# Patient Record
Sex: Female | Born: 1935 | Race: Black or African American | Hispanic: No | Smoking: Never smoker
Health system: Southern US, Community
[De-identification: ages and names within clinical notes are randomized; demographics above are authoritative.]

## PROBLEM LIST (undated history)

## (undated) DIAGNOSIS — I209 Angina pectoris, unspecified: Secondary | ICD-10-CM

## (undated) DIAGNOSIS — I4891 Unspecified atrial fibrillation: Secondary | ICD-10-CM

## (undated) DIAGNOSIS — I219 Acute myocardial infarction, unspecified: Secondary | ICD-10-CM

## (undated) DIAGNOSIS — I2510 Atherosclerotic heart disease of native coronary artery without angina pectoris: Secondary | ICD-10-CM

## (undated) DIAGNOSIS — I499 Cardiac arrhythmia, unspecified: Secondary | ICD-10-CM

## (undated) DIAGNOSIS — I82409 Acute embolism and thrombosis of unspecified deep veins of unspecified lower extremity: Secondary | ICD-10-CM

## (undated) DIAGNOSIS — M1990 Unspecified osteoarthritis, unspecified site: Secondary | ICD-10-CM

## (undated) DIAGNOSIS — S065XAA Traumatic subdural hemorrhage with loss of consciousness status unknown, initial encounter: Secondary | ICD-10-CM

## (undated) DIAGNOSIS — I10 Essential (primary) hypertension: Secondary | ICD-10-CM

## (undated) DIAGNOSIS — S065X9A Traumatic subdural hemorrhage with loss of consciousness of unspecified duration, initial encounter: Secondary | ICD-10-CM

## (undated) HISTORY — PX: SUBDURAL HEMATOMA EVACUATION VIA CRANIOTOMY: SUR319

---

## 2009-02-27 ENCOUNTER — Emergency Department (HOSPITAL_COMMUNITY): Admission: EM | Admit: 2009-02-27 | Discharge: 2009-02-27 | Payer: Self-pay | Admitting: Family Medicine

## 2009-03-04 ENCOUNTER — Emergency Department (HOSPITAL_COMMUNITY): Admission: EM | Admit: 2009-03-04 | Discharge: 2009-03-04 | Payer: Self-pay | Admitting: Family Medicine

## 2009-04-16 ENCOUNTER — Encounter: Admission: RE | Admit: 2009-04-16 | Discharge: 2009-04-16 | Payer: Self-pay | Admitting: Geriatric Medicine

## 2009-06-27 ENCOUNTER — Emergency Department (HOSPITAL_COMMUNITY): Admission: EM | Admit: 2009-06-27 | Discharge: 2009-06-27 | Payer: Self-pay | Admitting: Emergency Medicine

## 2009-07-10 ENCOUNTER — Emergency Department (HOSPITAL_COMMUNITY): Admission: EM | Admit: 2009-07-10 | Discharge: 2009-07-10 | Payer: Self-pay | Admitting: Emergency Medicine

## 2009-07-13 ENCOUNTER — Inpatient Hospital Stay (HOSPITAL_COMMUNITY): Admission: EM | Admit: 2009-07-13 | Discharge: 2009-07-30 | Payer: Self-pay | Admitting: Emergency Medicine

## 2009-07-26 ENCOUNTER — Ambulatory Visit: Payer: Self-pay | Admitting: Vascular Surgery

## 2009-07-26 ENCOUNTER — Encounter (INDEPENDENT_AMBULATORY_CARE_PROVIDER_SITE_OTHER): Payer: Self-pay | Admitting: Internal Medicine

## 2009-08-08 ENCOUNTER — Encounter: Admission: RE | Admit: 2009-08-08 | Discharge: 2009-08-08 | Payer: Self-pay | Admitting: Neurosurgery

## 2009-08-23 ENCOUNTER — Emergency Department (HOSPITAL_COMMUNITY): Admission: EM | Admit: 2009-08-23 | Discharge: 2009-08-24 | Payer: Self-pay | Admitting: Emergency Medicine

## 2009-09-17 ENCOUNTER — Encounter: Admission: RE | Admit: 2009-09-17 | Discharge: 2009-09-17 | Payer: Self-pay | Admitting: Neurosurgery

## 2009-11-14 IMAGING — CT CT HEAD W/O CM
1 series · 15 of 28 positions shown, 19 images · non-contrast
Comparison: 07/22/2009

CLINICAL DATA: Follow-up subdural hematoma.  History of frequent
falls.  Some headaches.

CT HEAD WITHOUT CONTRAST
TECHNIQUE: Contiguous axial images were obtained from the base of
the skull through the vertex without contrast.

[Series 2: head · axial · 0.49mm/px · z∈[+38,+170]mm · 15 of 28 slices shown, 19 images]
[im 2/28  brain]
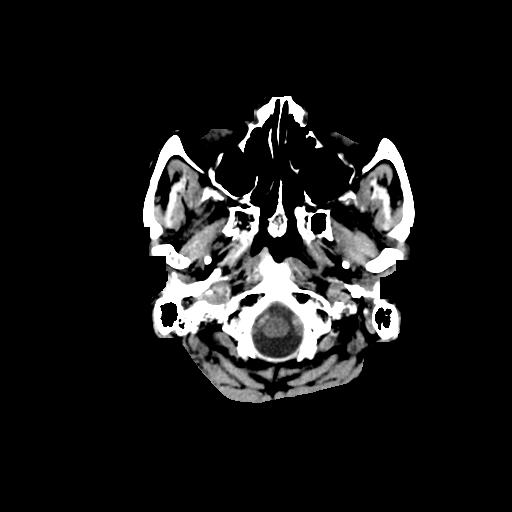
[im 2/28  bone]
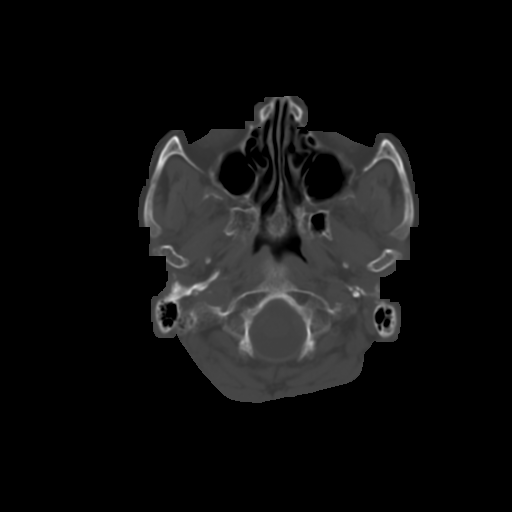
[im 4/28  brain]
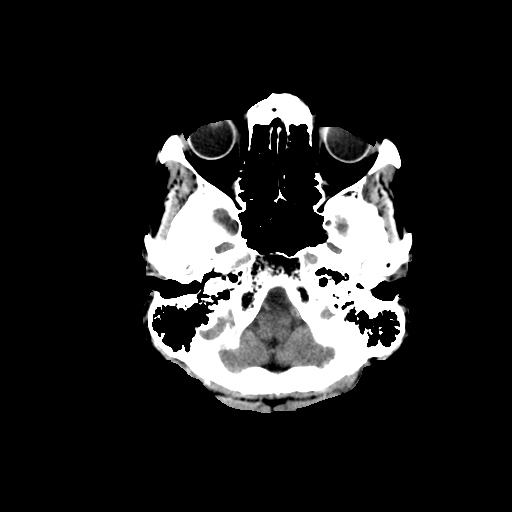
[im 6/28  brain]
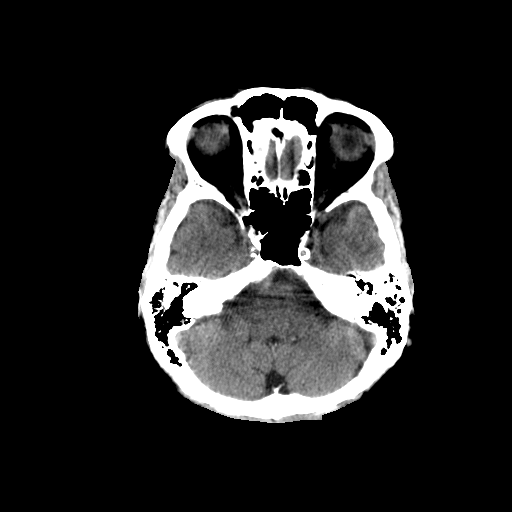
[im 8/28  brain]
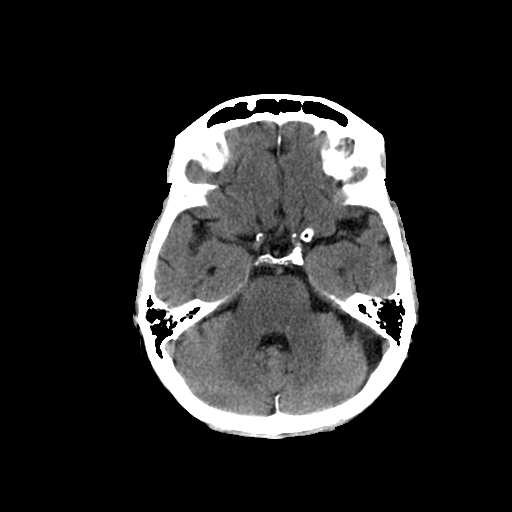
[im 9/28  brain]
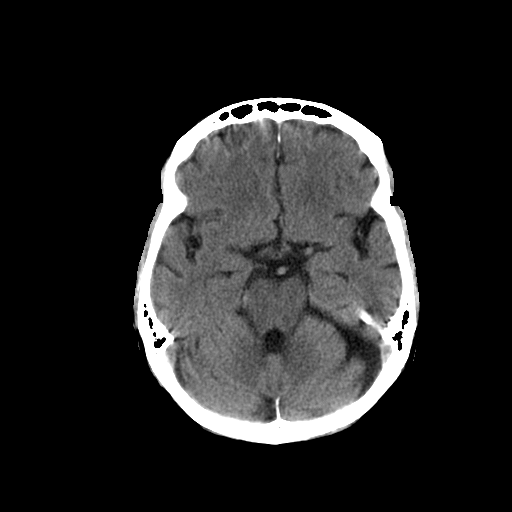
[im 9/28  bone]
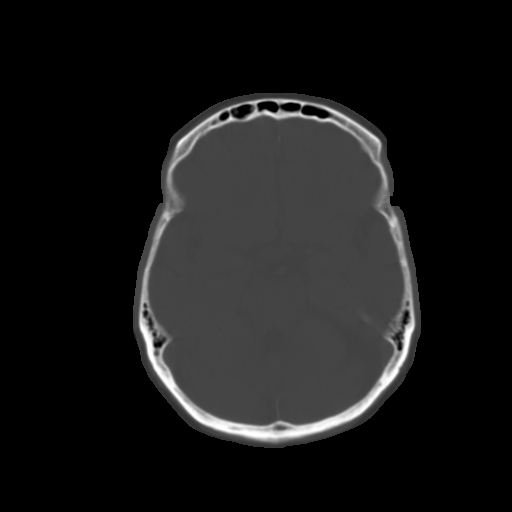
[im 11/28  brain]
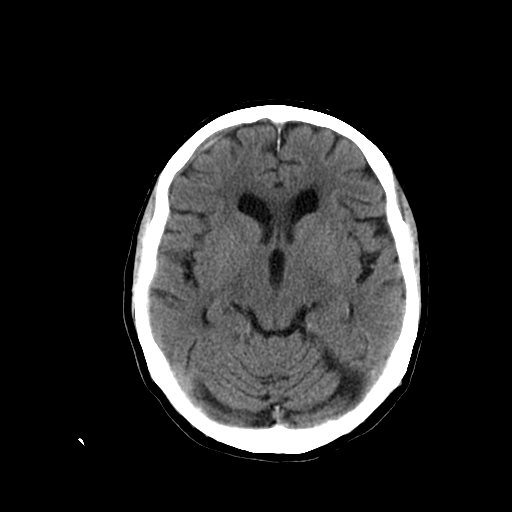
[im 13/28  brain]
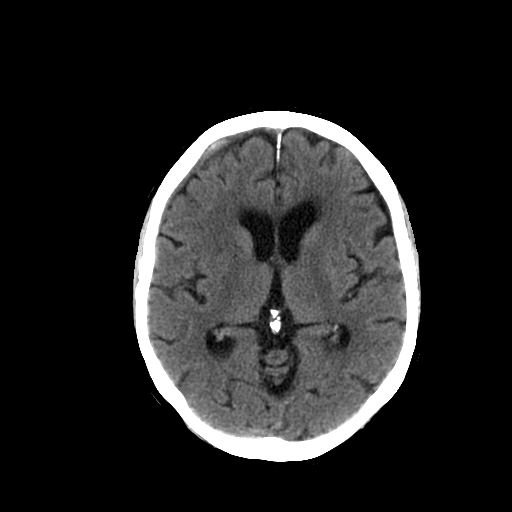
[im 15/28  brain]
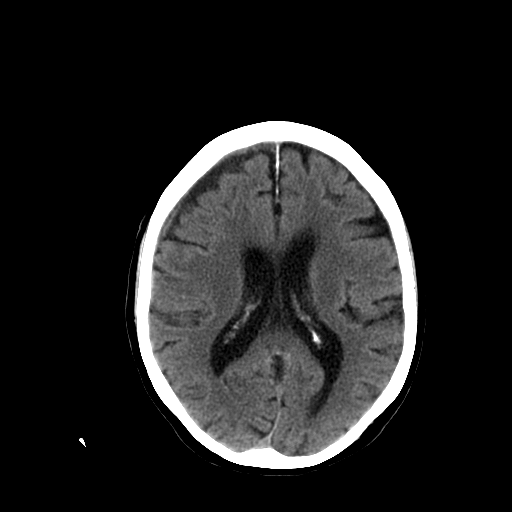
[im 16/28  brain]
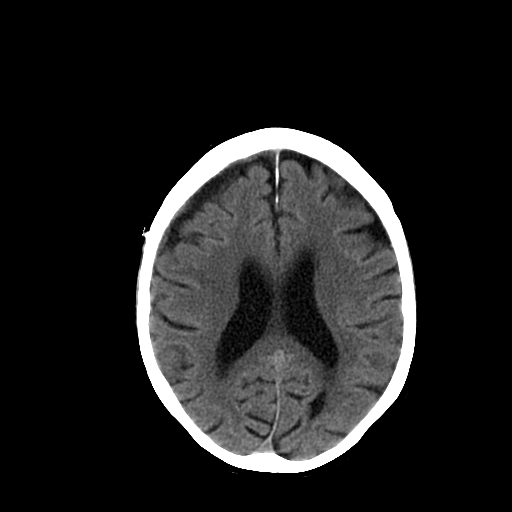
[im 16/28  bone]
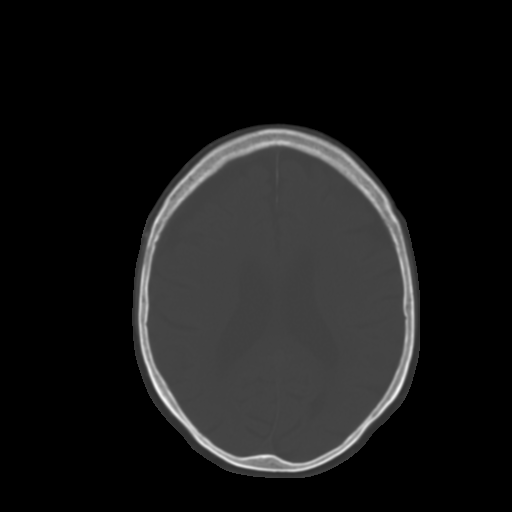
[im 18/28  brain]
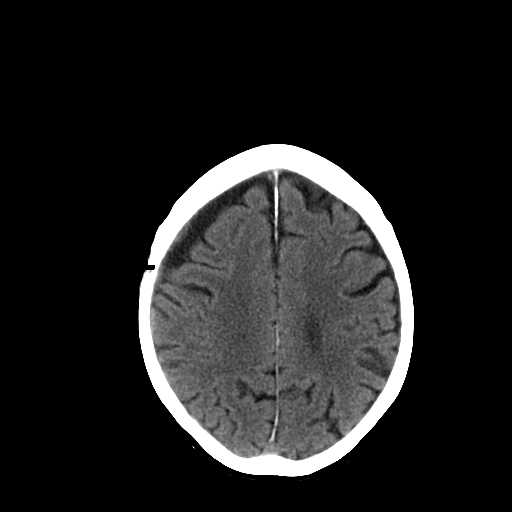
[im 20/28  brain]
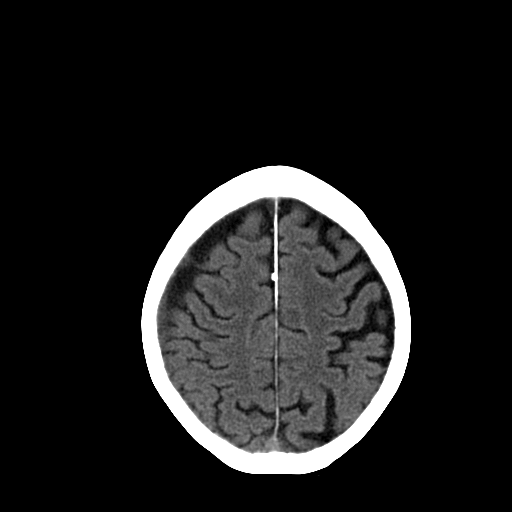
[im 21/28  brain]
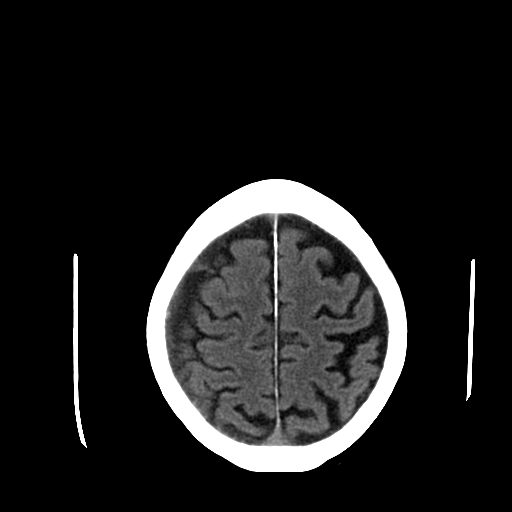
[im 23/28  brain]
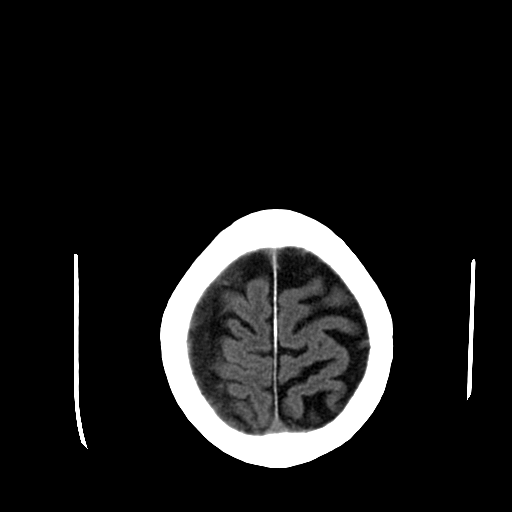
[im 23/28  bone]
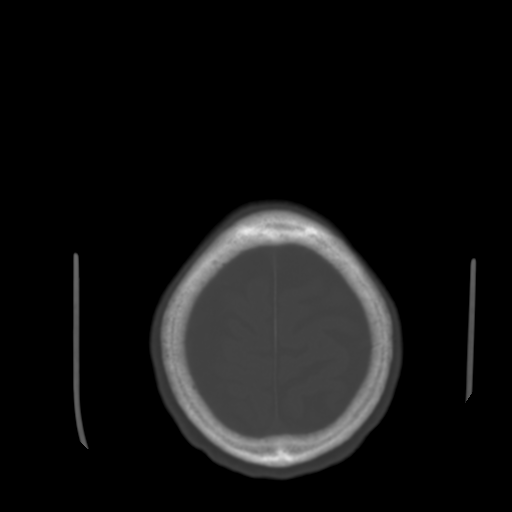
[im 25/28  brain]
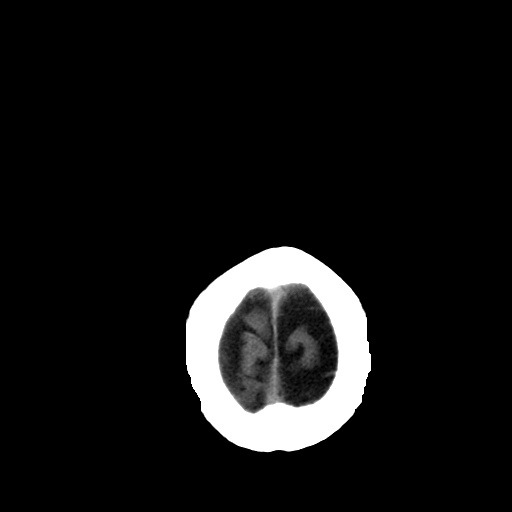
[im 27/28  brain]
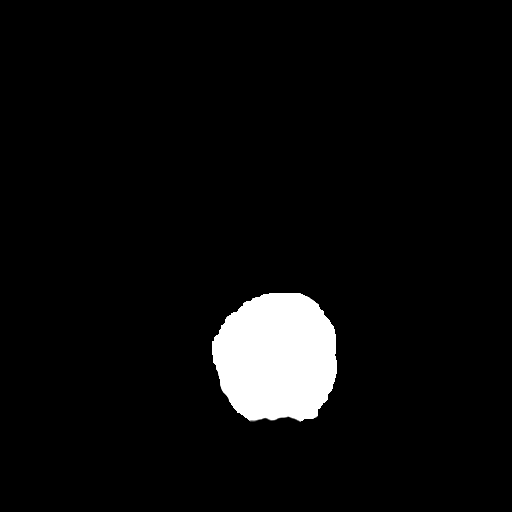

[15 of 28 positions shown; findings below may reference images not displayed]

FINDINGS: The patient is status post burr hole evacuation for a
right subdural hematoma.  Pneumocephalus has resolved.  There is
minimal residual extra-axial fluid on the right but overall the
collection has improved significantly.  Attenuation is that of CSF
or nearly CSF.  Measurement of the thickness of the frontal extra-
axial fluid collection on image 18 measures 7 mm as compared with
10 mm previously.  There is no evidence for acute hemorrhage.  The
left hemisphere is unaffected.

Baseline atrophy with chronic microvascular ischemic change again
noted.  Mild extra-axial fluid the posterior fossa likely represent
small bilateral infratentorial subdural hygromas.
IMPRESSION: Improved right subdural hematoma post evacuation.  Mild residual
extra-axial fluid on the right is decreased in thickness and
extent.

## 2009-12-24 IMAGING — CT CT HEAD W/O CM
2 series · 15 of 30 positions shown, 19 images · non-contrast
Comparison: Unenhanced cranial CT 08/08/2009 and 07/22/2009.

CLINICAL DATA: Follow up right hemispheric subdural hematoma.

CT HEAD WITHOUT CONTRAST 09/17/2009:
TECHNIQUE: Contiguous axial images were obtained from the base of
the skull through the vertex without contrast.

[Series 3: head bone · axial · 0.49mm/px · z∈[+24,+45]mm · 2 of 28 slices shown]
[im 2/28  bone]
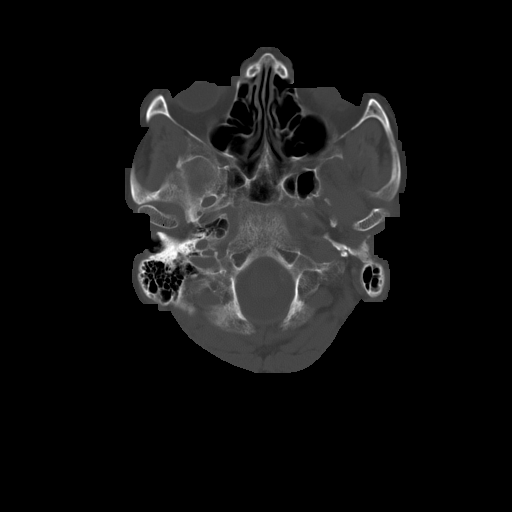
[im 6/28  bone]
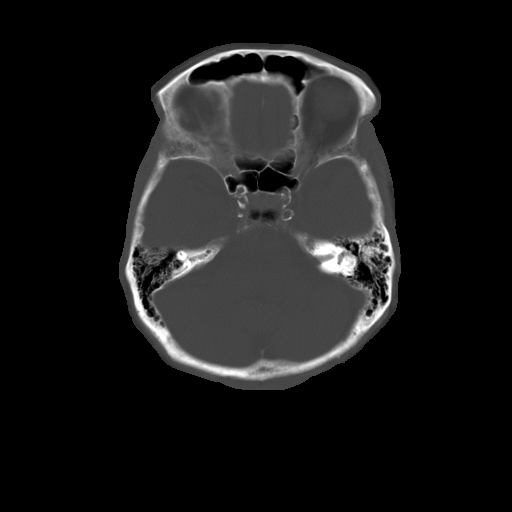

[Series 32: 3d filtered head w/o · axial · non-contrast · 0.49mm/px · z∈[+24,+151]mm · 13 of 28 slices shown, 17 images]
[im 2/28  brain]
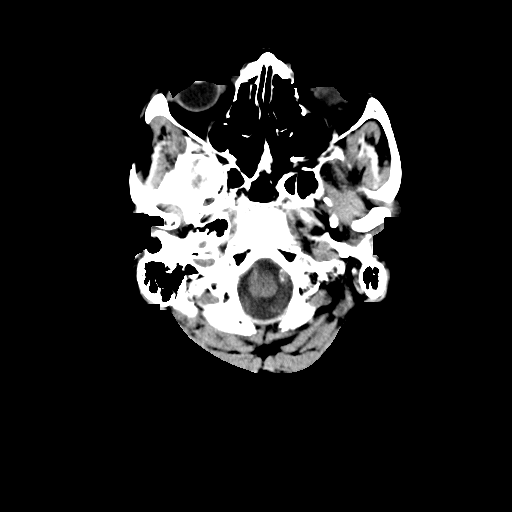
[im 2/28  bone]
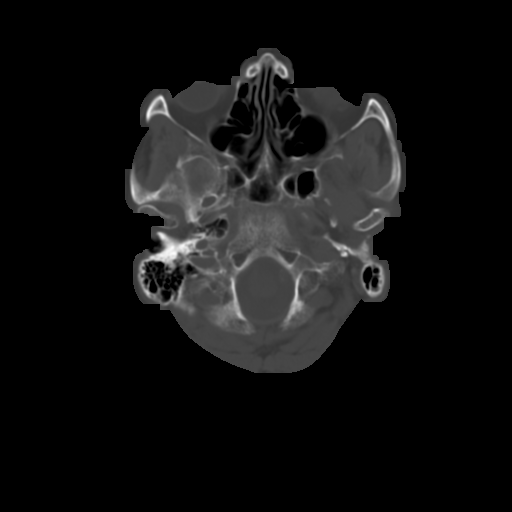
[im 4/28  brain]
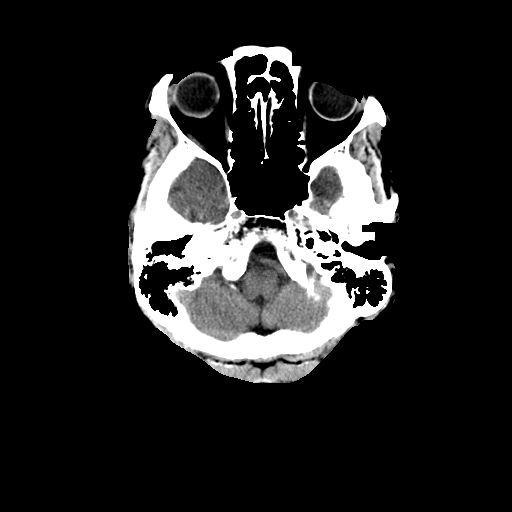
[im 6/28  brain]
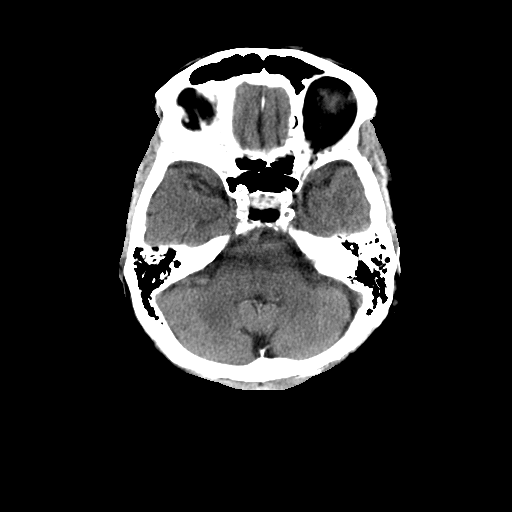
[im 8/28  brain]
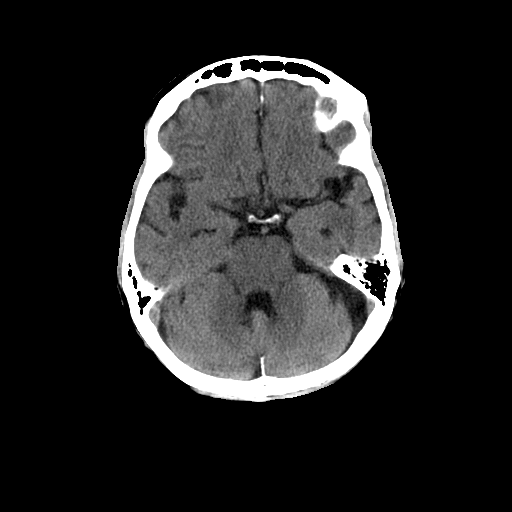
[im 10/28  brain]
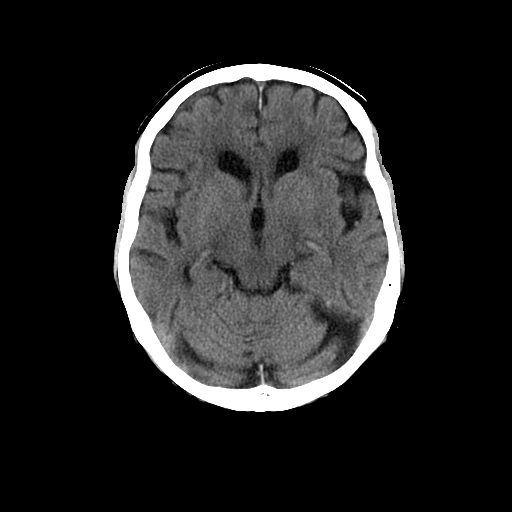
[im 10/28  bone]
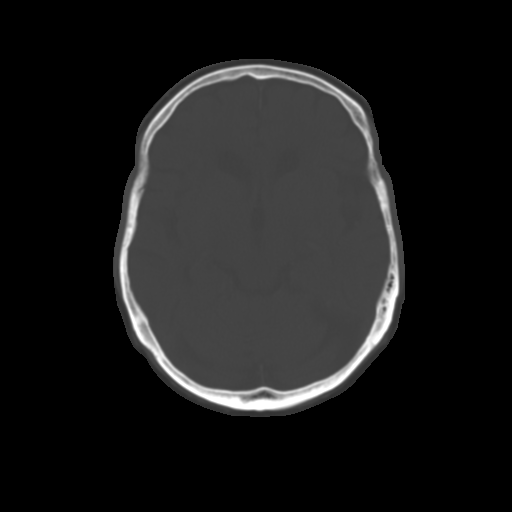
[im 12/28  brain]
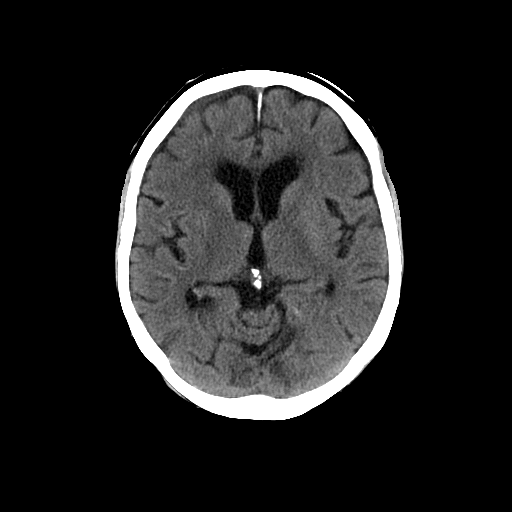
[im 14/28  brain]
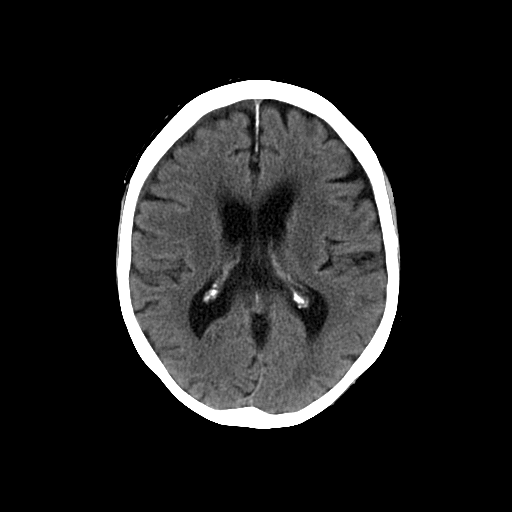
[im 16/28  brain]
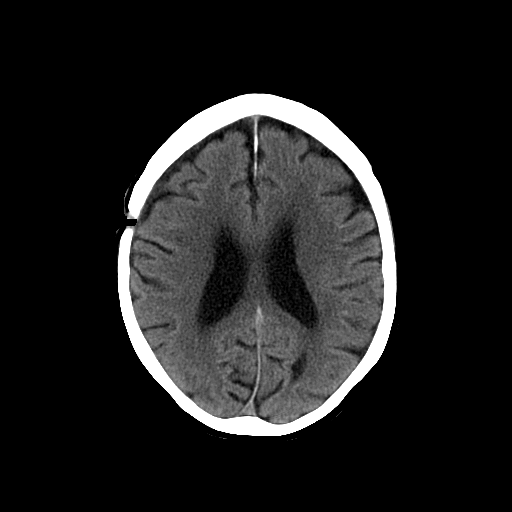
[im 18/28  brain]
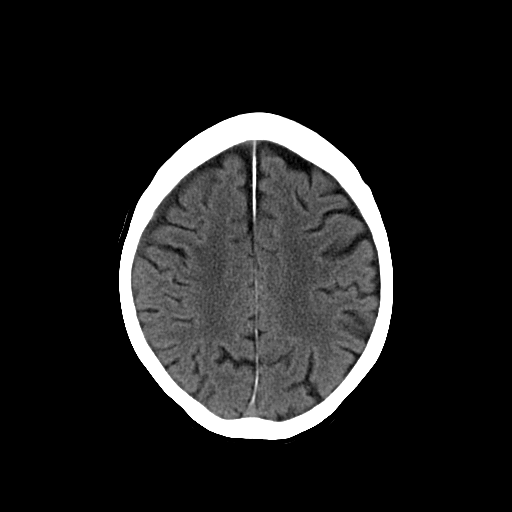
[im 18/28  bone]
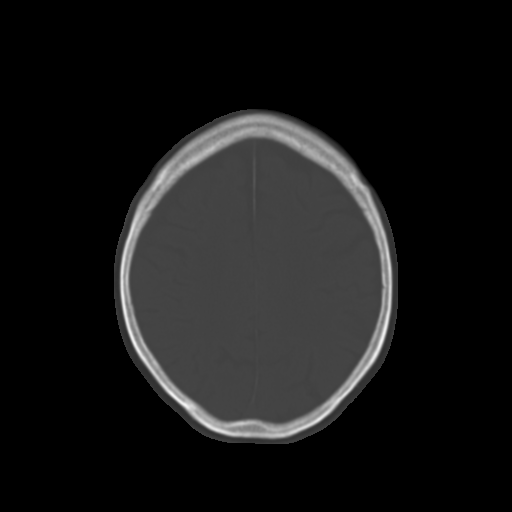
[im 20/28  brain]
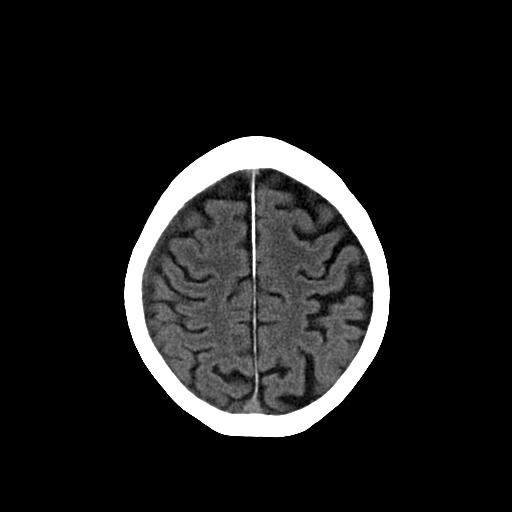
[im 22/28  brain]
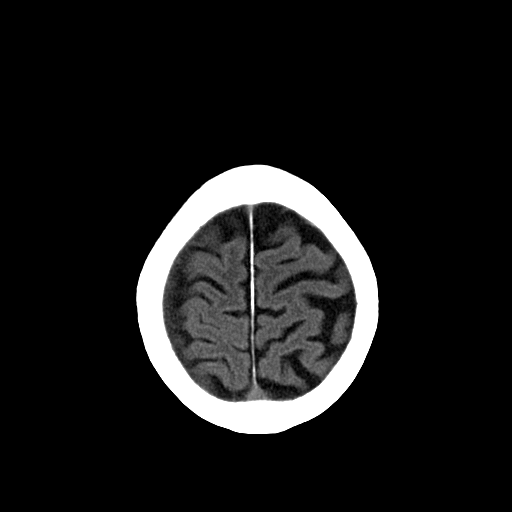
[im 24/28  brain]
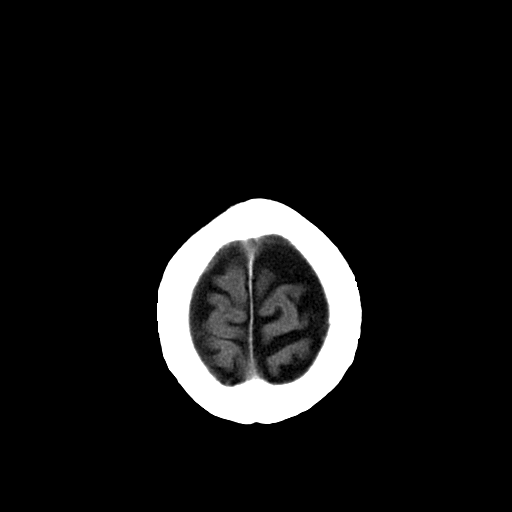
[im 26/28  brain]
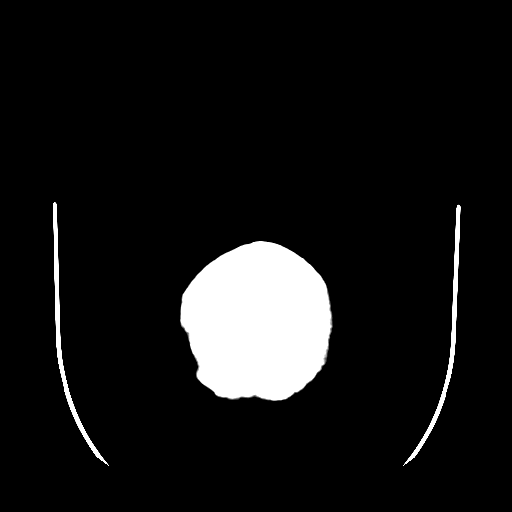
[im 26/28  bone]
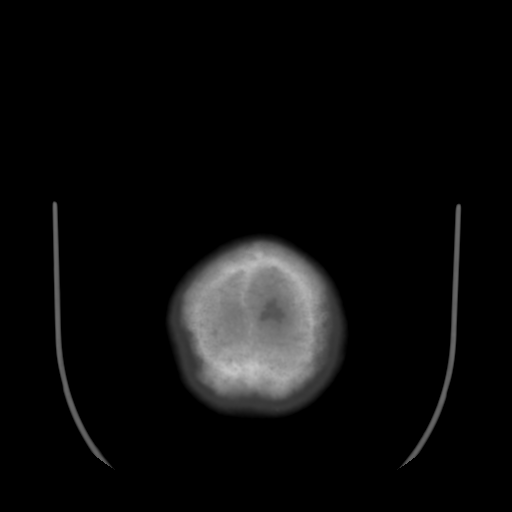

[15 of 30 positions shown; findings below may reference images not displayed]

FINDINGS: Interval near complete resolution of the right subdural
hematoma, with only minimal chronic right frontal subdural blood
persisting.  No associated mass effect or midline shift currently.
No acute hemorrhage or hematoma.  Stable moderate cortical, deep,
and cerebellar vermian atrophy.  Stable moderate changes of small
vessel disease of the white matter diffusely.  No evidence of acute
infarction at this time.  No new intracranial abnormalities.

Right frontal burr hole.  Visualized paranasal sinuses, mastoid air
cells, and middle ear cavities well-aerated.  Bilateral carotid
siphon and vertebral artery atherosclerosis again noted.
IMPRESSION: 1.  Near complete resolution of the right subdural hematoma, with
only minimal chronic subdural blood persisting in the right frontal
region.
2.  No associated mass effect or midline shift.
3.  Stable moderate generalized atrophy and moderate chronic
microvascular ischemic changes of the white matter diffusely.
4.  No new intracranial abnormalities.

## 2010-09-06 ENCOUNTER — Inpatient Hospital Stay (HOSPITAL_COMMUNITY): Admission: EM | Admit: 2010-09-06 | Discharge: 2010-09-10 | Payer: Self-pay | Admitting: Emergency Medicine

## 2010-09-07 ENCOUNTER — Encounter (INDEPENDENT_AMBULATORY_CARE_PROVIDER_SITE_OTHER): Payer: Self-pay | Admitting: Internal Medicine

## 2010-09-23 ENCOUNTER — Emergency Department (HOSPITAL_COMMUNITY): Admission: EM | Admit: 2010-09-23 | Discharge: 2010-09-23 | Payer: Self-pay | Admitting: Emergency Medicine

## 2010-12-16 ENCOUNTER — Emergency Department (HOSPITAL_COMMUNITY)
Admission: EM | Admit: 2010-12-16 | Discharge: 2010-12-16 | Payer: Self-pay | Source: Home / Self Care | Admitting: Emergency Medicine

## 2010-12-17 LAB — URINALYSIS, ROUTINE W REFLEX MICROSCOPIC
Bilirubin Urine: NEGATIVE
Ketones, ur: NEGATIVE mg/dL
Nitrite: NEGATIVE
Protein, ur: NEGATIVE mg/dL
Specific Gravity, Urine: 1.01 (ref 1.005–1.030)
Urine Glucose, Fasting: NEGATIVE mg/dL
Urobilinogen, UA: 0.2 mg/dL (ref 0.0–1.0)
pH: 5.5 (ref 5.0–8.0)

## 2010-12-17 LAB — COMPREHENSIVE METABOLIC PANEL
ALT: 17 U/L (ref 0–35)
AST: 21 U/L (ref 0–37)
Albumin: 3.4 g/dL — ABNORMAL LOW (ref 3.5–5.2)
Alkaline Phosphatase: 39 U/L (ref 39–117)
BUN: 17 mg/dL (ref 6–23)
CO2: 25 mEq/L (ref 19–32)
Calcium: 8.9 mg/dL (ref 8.4–10.5)
Chloride: 106 mEq/L (ref 96–112)
Creatinine, Ser: 1.21 mg/dL — ABNORMAL HIGH (ref 0.4–1.2)
GFR calc Af Amer: 50 mL/min — ABNORMAL LOW (ref >60)
GFR calc non Af Amer: 42 mL/min — ABNORMAL LOW (ref >60)
Glucose, Bld: 107 mg/dL — ABNORMAL HIGH (ref 70–99)
Potassium: 4.5 mEq/L (ref 3.5–5.1)
Sodium: 139 mEq/L (ref 135–145)
Total Bilirubin: 0.6 mg/dL (ref 0.3–1.2)
Total Protein: 7.2 g/dL (ref 6.0–8.3)

## 2010-12-17 LAB — CBC
HCT: 38.3 % (ref 36.0–46.0)
Hemoglobin: 12.2 g/dL (ref 12.0–15.0)
MCH: 27.1 pg (ref 26.0–34.0)
MCHC: 31.9 g/dL (ref 30.0–36.0)
MCV: 84.9 fL (ref 78.0–100.0)
Platelets: 209 10*3/uL (ref 150–400)
RBC: 4.51 MIL/uL (ref 3.87–5.11)
RDW: 18.8 % — ABNORMAL HIGH (ref 11.5–15.5)
WBC: 6.2 10*3/uL (ref 4.0–10.5)

## 2010-12-17 LAB — URINE MICROSCOPIC-ADD ON

## 2011-02-11 LAB — CBC
MCHC: 31.6 g/dL (ref 30.0–36.0)
Platelets: 198 10*3/uL (ref 150–400)
RDW: 15.8 % — ABNORMAL HIGH (ref 11.5–15.5)
WBC: 5.6 10*3/uL (ref 4.0–10.5)

## 2011-02-11 LAB — DIFFERENTIAL
Basophils Absolute: 0 10*3/uL (ref 0.0–0.1)
Basophils Relative: 0 % (ref 0–1)
Eosinophils Relative: 0 % (ref 0–5)
Lymphocytes Relative: 35 % (ref 12–46)
Neutro Abs: 2.8 10*3/uL (ref 1.7–7.7)

## 2011-02-11 LAB — HEPATIC FUNCTION PANEL
ALT: 16 U/L (ref 0–35)
AST: 18 U/L (ref 0–37)
Total Protein: 6.4 g/dL (ref 6.0–8.3)

## 2011-02-11 LAB — URINALYSIS, ROUTINE W REFLEX MICROSCOPIC
Bilirubin Urine: NEGATIVE
Nitrite: NEGATIVE
Specific Gravity, Urine: 1.012 (ref 1.005–1.030)
Urobilinogen, UA: 0.2 mg/dL (ref 0.0–1.0)
pH: 7.5 (ref 5.0–8.0)

## 2011-02-11 LAB — BASIC METABOLIC PANEL
BUN: 16 mg/dL (ref 6–23)
Creatinine, Ser: 1.14 mg/dL (ref 0.4–1.2)
GFR calc non Af Amer: 45 mL/min — ABNORMAL LOW (ref >60)

## 2011-02-11 LAB — URINE MICROSCOPIC-ADD ON

## 2011-02-12 LAB — CBC
HCT: 36.2 % (ref 36.0–46.0)
HCT: 37.6 % (ref 36.0–46.0)
Hemoglobin: 11.3 g/dL — ABNORMAL LOW (ref 12.0–15.0)
Hemoglobin: 12.1 g/dL (ref 12.0–15.0)
MCH: 27.8 pg (ref 26.0–34.0)
MCH: 28.3 pg (ref 26.0–34.0)
MCH: 28.5 pg (ref 26.0–34.0)
MCHC: 31.4 g/dL (ref 30.0–36.0)
MCHC: 31.7 g/dL (ref 30.0–36.0)
MCHC: 32 g/dL (ref 30.0–36.0)
MCHC: 32 g/dL (ref 30.0–36.0)
MCHC: 32.2 g/dL (ref 30.0–36.0)
MCV: 88.5 fL (ref 78.0–100.0)
MCV: 88.5 fL (ref 78.0–100.0)
MCV: 88.6 fL (ref 78.0–100.0)
Platelets: 238 10*3/uL (ref 150–400)
Platelets: 242 10*3/uL (ref 150–400)
RBC: 3.99 MIL/uL (ref 3.87–5.11)
RBC: 4.03 MIL/uL (ref 3.87–5.11)
RBC: 4.25 MIL/uL (ref 3.87–5.11)
RDW: 15.3 % (ref 11.5–15.5)
WBC: 5 10*3/uL (ref 4.0–10.5)

## 2011-02-12 LAB — DIFFERENTIAL
Basophils Absolute: 0 10*3/uL (ref 0.0–0.1)
Basophils Absolute: 0 10*3/uL (ref 0.0–0.1)
Basophils Absolute: 0 10*3/uL (ref 0.0–0.1)
Basophils Relative: 0 % (ref 0–1)
Eosinophils Absolute: 0 10*3/uL (ref 0.0–0.7)
Eosinophils Absolute: 0 10*3/uL (ref 0.0–0.7)
Eosinophils Relative: 0 % (ref 0–5)
Eosinophils Relative: 1 % (ref 0–5)
Lymphocytes Relative: 27 % (ref 12–46)
Lymphocytes Relative: 33 % (ref 12–46)
Lymphocytes Relative: 34 % (ref 12–46)
Lymphs Abs: 1.4 10*3/uL (ref 0.7–4.0)
Lymphs Abs: 1.7 10*3/uL (ref 0.7–4.0)
Lymphs Abs: 1.8 10*3/uL (ref 0.7–4.0)
Lymphs Abs: 2.2 10*3/uL (ref 0.7–4.0)
Monocytes Absolute: 0.7 10*3/uL (ref 0.1–1.0)
Monocytes Absolute: 0.7 10*3/uL (ref 0.1–1.0)
Monocytes Relative: 13 % — ABNORMAL HIGH (ref 3–12)
Neutro Abs: 2.5 10*3/uL (ref 1.7–7.7)
Neutro Abs: 2.9 10*3/uL (ref 1.7–7.7)
Neutro Abs: 3.4 10*3/uL (ref 1.7–7.7)
Neutrophils Relative %: 50 % (ref 43–77)
Neutrophils Relative %: 52 % (ref 43–77)
Neutrophils Relative %: 54 % (ref 43–77)
Neutrophils Relative %: 60 % (ref 43–77)

## 2011-02-12 LAB — GLUCOSE, CAPILLARY
Glucose-Capillary: 102 mg/dL — ABNORMAL HIGH (ref 70–99)
Glucose-Capillary: 86 mg/dL (ref 70–99)

## 2011-02-12 LAB — POCT CARDIAC MARKERS
CKMB, poc: 1.3 ng/mL (ref 1.0–8.0)
Troponin i, poc: <0.05 ng/mL (ref 0.00–0.09)

## 2011-02-12 LAB — COMPREHENSIVE METABOLIC PANEL
ALT: 16 U/L (ref 0–35)
AST: 17 U/L (ref 0–37)
AST: 20 U/L (ref 0–37)
Albumin: 2.5 g/dL — ABNORMAL LOW (ref 3.5–5.2)
Albumin: 2.6 g/dL — ABNORMAL LOW (ref 3.5–5.2)
Alkaline Phosphatase: 35 U/L — ABNORMAL LOW (ref 39–117)
BUN: 11 mg/dL (ref 6–23)
BUN: 15 mg/dL (ref 6–23)
BUN: 15 mg/dL (ref 6–23)
CO2: 24 mEq/L (ref 19–32)
CO2: 25 mEq/L (ref 19–32)
CO2: 28 mEq/L (ref 19–32)
Calcium: 8 mg/dL — ABNORMAL LOW (ref 8.4–10.5)
Calcium: 8.1 mg/dL — ABNORMAL LOW (ref 8.4–10.5)
Calcium: 8.2 mg/dL — ABNORMAL LOW (ref 8.4–10.5)
Calcium: 8.4 mg/dL (ref 8.4–10.5)
Calcium: 8.7 mg/dL (ref 8.4–10.5)
Chloride: 107 mEq/L (ref 96–112)
Chloride: 107 mEq/L (ref 96–112)
Chloride: 108 mEq/L (ref 96–112)
Chloride: 110 mEq/L (ref 96–112)
Creatinine, Ser: 0.87 mg/dL (ref 0.4–1.2)
Creatinine, Ser: 1.04 mg/dL (ref 0.4–1.2)
Creatinine, Ser: 1.07 mg/dL (ref 0.4–1.2)
GFR calc Af Amer: >60 mL/min (ref >60)
GFR calc Af Amer: >60 mL/min (ref >60)
GFR calc non Af Amer: 48 mL/min — ABNORMAL LOW (ref >60)
GFR calc non Af Amer: 50 mL/min — ABNORMAL LOW (ref >60)
GFR calc non Af Amer: 53 mL/min — ABNORMAL LOW (ref >60)
GFR calc non Af Amer: >60 mL/min (ref >60)
Glucose, Bld: 87 mg/dL (ref 70–99)
Glucose, Bld: 94 mg/dL (ref 70–99)
Glucose, Bld: 95 mg/dL (ref 70–99)
Potassium: 4.2 mEq/L (ref 3.5–5.1)
Sodium: 142 mEq/L (ref 135–145)
Sodium: 143 mEq/L (ref 135–145)
Total Bilirubin: 0.4 mg/dL (ref 0.3–1.2)
Total Bilirubin: 0.4 mg/dL (ref 0.3–1.2)
Total Bilirubin: 0.6 mg/dL (ref 0.3–1.2)
Total Bilirubin: 0.6 mg/dL (ref 0.3–1.2)
Total Protein: 5.9 g/dL — ABNORMAL LOW (ref 6.0–8.3)

## 2011-02-12 LAB — CK TOTAL AND CKMB (NOT AT ARMC)
CK, MB: 2.1 ng/mL (ref 0.3–4.0)
Relative Index: 2.1 (ref 0.0–2.5)

## 2011-02-12 LAB — MRSA PCR SCREENING: MRSA by PCR: POSITIVE — AB

## 2011-02-12 LAB — CARDIAC PANEL(CRET KIN+CKTOT+MB+TROPI)
Relative Index: INVALID (ref 0.0–2.5)
Relative Index: INVALID (ref 0.0–2.5)
Total CK: 88 U/L (ref 7–177)
Troponin I: 0.02 ng/mL (ref 0.00–0.06)

## 2011-02-12 LAB — BRAIN NATRIURETIC PEPTIDE
Pro B Natriuretic peptide (BNP): 1021 pg/mL — ABNORMAL HIGH (ref 0.0–100.0)
Pro B Natriuretic peptide (BNP): 634 pg/mL — ABNORMAL HIGH (ref 0.0–100.0)

## 2011-02-12 LAB — URINALYSIS, ROUTINE W REFLEX MICROSCOPIC
Bilirubin Urine: NEGATIVE
Glucose, UA: NEGATIVE mg/dL
Ketones, ur: 15 mg/dL — AB
Protein, ur: 30 mg/dL — AB
Urobilinogen, UA: 0.2 mg/dL (ref 0.0–1.0)

## 2011-02-12 LAB — TROPONIN I: Troponin I: 0.03 ng/mL (ref 0.00–0.06)

## 2011-02-12 LAB — PHOSPHORUS: Phosphorus: 3 mg/dL (ref 2.3–4.6)

## 2011-02-12 LAB — TSH: TSH: 4.046 u[IU]/mL (ref 0.350–4.500)

## 2011-02-12 LAB — LIPASE, BLOOD: Lipase: 34 U/L (ref 11–59)

## 2011-02-25 ENCOUNTER — Ambulatory Visit
Admission: RE | Admit: 2011-02-25 | Discharge: 2011-02-25 | Disposition: A | Discharge status: Home / Self Care | Payer: Medicare HMO | Source: Ambulatory Visit | Attending: Gastroenterology | Admitting: Gastroenterology

## 2011-02-25 ENCOUNTER — Other Ambulatory Visit: Payer: Self-pay | Admitting: Gastroenterology

## 2011-02-25 DIAGNOSIS — R197 Diarrhea, unspecified: Secondary | ICD-10-CM

## 2011-03-06 LAB — POCT I-STAT, CHEM 8
BUN: 20 mg/dL (ref 6–23)
Calcium, Ion: 1.19 mmol/L (ref 1.12–1.32)
Creatinine, Ser: 0.6 mg/dL (ref 0.4–1.2)
TCO2: 28 mmol/L (ref 0–100)

## 2011-03-06 LAB — CBC
HCT: 35.8 % — ABNORMAL LOW (ref 36.0–46.0)
Hemoglobin: 12 g/dL (ref 12.0–15.0)
WBC: 7.1 10*3/uL (ref 4.0–10.5)

## 2011-03-06 LAB — DIFFERENTIAL
Eosinophils Relative: 0 % (ref 0–5)
Lymphocytes Relative: 21 % (ref 12–46)
Lymphs Abs: 1.5 10*3/uL (ref 0.7–4.0)
Monocytes Absolute: 0.8 10*3/uL (ref 0.1–1.0)
Monocytes Relative: 12 % (ref 3–12)

## 2011-03-06 LAB — URINALYSIS, ROUTINE W REFLEX MICROSCOPIC
Glucose, UA: NEGATIVE mg/dL
Ketones, ur: NEGATIVE mg/dL
Protein, ur: NEGATIVE mg/dL

## 2011-03-06 LAB — HEPATIC FUNCTION PANEL
Alkaline Phosphatase: 46 U/L (ref 39–117)
Indirect Bilirubin: 0.4 mg/dL (ref 0.3–0.9)
Total Bilirubin: 0.5 mg/dL (ref 0.3–1.2)

## 2011-03-06 LAB — URINE MICROSCOPIC-ADD ON

## 2011-03-06 LAB — URINE CULTURE: Colony Count: 45000

## 2011-03-07 LAB — BASIC METABOLIC PANEL
BUN: 11 mg/dL (ref 6–23)
BUN: 21 mg/dL (ref 6–23)
BUN: 33 mg/dL — ABNORMAL HIGH (ref 6–23)
BUN: 34 mg/dL — ABNORMAL HIGH (ref 6–23)
BUN: 35 mg/dL — ABNORMAL HIGH (ref 6–23)
BUN: 40 mg/dL — ABNORMAL HIGH (ref 6–23)
BUN: 21 mg/dL (ref 6–23)
CO2: 27 mEq/L (ref 19–32)
CO2: 28 mEq/L (ref 19–32)
CO2: 29 mEq/L (ref 19–32)
CO2: 29 mEq/L (ref 19–32)
CO2: 29 mEq/L (ref 19–32)
CO2: 31 mEq/L (ref 19–32)
CO2: 26 mEq/L (ref 19–32)
Calcium: 10.1 mg/dL (ref 8.4–10.5)
Calcium: 8.2 mg/dL — ABNORMAL LOW (ref 8.4–10.5)
Calcium: 8.2 mg/dL — ABNORMAL LOW (ref 8.4–10.5)
Calcium: 8.9 mg/dL (ref 8.4–10.5)
Calcium: 9.8 mg/dL (ref 8.4–10.5)
Calcium: 9.8 mg/dL (ref 8.4–10.5)
Chloride: 100 mEq/L (ref 96–112)
Chloride: 101 mEq/L (ref 96–112)
Chloride: 102 mEq/L (ref 96–112)
Chloride: 105 mEq/L (ref 96–112)
Chloride: 98 mEq/L (ref 96–112)
Creatinine, Ser: 0.71 mg/dL (ref 0.4–1.2)
Creatinine, Ser: 0.77 mg/dL (ref 0.4–1.2)
Creatinine, Ser: 0.84 mg/dL (ref 0.4–1.2)
Creatinine, Ser: 0.96 mg/dL (ref 0.4–1.2)
Creatinine, Ser: 1.1 mg/dL (ref 0.4–1.2)
Creatinine, Ser: 1.17 mg/dL (ref 0.4–1.2)
Creatinine, Ser: 1.2 mg/dL (ref 0.4–1.2)
Creatinine, Ser: 1.28 mg/dL — ABNORMAL HIGH (ref 0.4–1.2)
GFR calc Af Amer: 47 mL/min — ABNORMAL LOW (ref >60)
GFR calc Af Amer: 56 mL/min — ABNORMAL LOW (ref >60)
GFR calc Af Amer: >60 mL/min (ref >60)
GFR calc Af Amer: >60 mL/min (ref >60)
GFR calc Af Amer: >60 mL/min (ref >60)
GFR calc Af Amer: >60 mL/min (ref >60)
GFR calc non Af Amer: 42 mL/min — ABNORMAL LOW (ref >60)
GFR calc non Af Amer: 43 mL/min — ABNORMAL LOW (ref >60)
GFR calc non Af Amer: 47 mL/min — ABNORMAL LOW (ref >60)
GFR calc non Af Amer: 55 mL/min — ABNORMAL LOW (ref >60)
GFR calc non Af Amer: 48 mL/min — ABNORMAL LOW (ref >60)
Glucose, Bld: 110 mg/dL — ABNORMAL HIGH (ref 70–99)
Glucose, Bld: 112 mg/dL — ABNORMAL HIGH (ref 70–99)
Glucose, Bld: 96 mg/dL (ref 70–99)
Glucose, Bld: 163 mg/dL — ABNORMAL HIGH (ref 70–99)
Potassium: 3.8 mEq/L (ref 3.5–5.1)
Potassium: 4.1 mEq/L (ref 3.5–5.1)
Potassium: 4.7 mEq/L (ref 3.5–5.1)
Potassium: 4.4 mEq/L (ref 3.5–5.1)
Sodium: 137 mEq/L (ref 135–145)
Sodium: 139 mEq/L (ref 135–145)
Sodium: 134 mEq/L — ABNORMAL LOW (ref 135–145)

## 2011-03-07 LAB — GLUCOSE, CAPILLARY
Glucose-Capillary: 104 mg/dL — ABNORMAL HIGH (ref 70–99)
Glucose-Capillary: 104 mg/dL — ABNORMAL HIGH (ref 70–99)
Glucose-Capillary: 107 mg/dL — ABNORMAL HIGH (ref 70–99)
Glucose-Capillary: 110 mg/dL — ABNORMAL HIGH (ref 70–99)
Glucose-Capillary: 111 mg/dL — ABNORMAL HIGH (ref 70–99)
Glucose-Capillary: 112 mg/dL — ABNORMAL HIGH (ref 70–99)
Glucose-Capillary: 117 mg/dL — ABNORMAL HIGH (ref 70–99)
Glucose-Capillary: 118 mg/dL — ABNORMAL HIGH (ref 70–99)
Glucose-Capillary: 118 mg/dL — ABNORMAL HIGH (ref 70–99)
Glucose-Capillary: 121 mg/dL — ABNORMAL HIGH (ref 70–99)
Glucose-Capillary: 125 mg/dL — ABNORMAL HIGH (ref 70–99)
Glucose-Capillary: 126 mg/dL — ABNORMAL HIGH (ref 70–99)
Glucose-Capillary: 128 mg/dL — ABNORMAL HIGH (ref 70–99)
Glucose-Capillary: 131 mg/dL — ABNORMAL HIGH (ref 70–99)
Glucose-Capillary: 132 mg/dL — ABNORMAL HIGH (ref 70–99)
Glucose-Capillary: 132 mg/dL — ABNORMAL HIGH (ref 70–99)
Glucose-Capillary: 146 mg/dL — ABNORMAL HIGH (ref 70–99)
Glucose-Capillary: 147 mg/dL — ABNORMAL HIGH (ref 70–99)
Glucose-Capillary: 153 mg/dL — ABNORMAL HIGH (ref 70–99)
Glucose-Capillary: 154 mg/dL — ABNORMAL HIGH (ref 70–99)
Glucose-Capillary: 171 mg/dL — ABNORMAL HIGH (ref 70–99)
Glucose-Capillary: 75 mg/dL (ref 70–99)
Glucose-Capillary: 86 mg/dL (ref 70–99)
Glucose-Capillary: 91 mg/dL (ref 70–99)
Glucose-Capillary: 92 mg/dL (ref 70–99)
Glucose-Capillary: 97 mg/dL (ref 70–99)
Glucose-Capillary: 99 mg/dL (ref 70–99)
Glucose-Capillary: 99 mg/dL (ref 70–99)
Glucose-Capillary: 168 mg/dL — ABNORMAL HIGH (ref 70–99)

## 2011-03-07 LAB — DIFFERENTIAL
Basophils Absolute: 0 10*3/uL (ref 0.0–0.1)
Basophils Relative: 0 % (ref 0–1)
Eosinophils Absolute: 0 10*3/uL (ref 0.0–0.7)
Monocytes Relative: 10 % (ref 3–12)
Neutro Abs: 3.8 10*3/uL (ref 1.7–7.7)
Neutrophils Relative %: 74 % (ref 43–77)

## 2011-03-07 LAB — CBC
HCT: 29.3 % — ABNORMAL LOW (ref 36.0–46.0)
HCT: 30.7 % — ABNORMAL LOW (ref 36.0–46.0)
HCT: 35.7 % — ABNORMAL LOW (ref 36.0–46.0)
HCT: 36.6 % (ref 36.0–46.0)
HCT: 37.3 % (ref 36.0–46.0)
HCT: 38.4 % (ref 36.0–46.0)
Hemoglobin: 9.7 g/dL — ABNORMAL LOW (ref 12.0–15.0)
Hemoglobin: 12.5 g/dL (ref 12.0–15.0)
Hemoglobin: 12.9 g/dL (ref 12.0–15.0)
MCHC: 33 g/dL (ref 30.0–36.0)
MCHC: 33.3 g/dL (ref 30.0–36.0)
MCHC: 33.4 g/dL (ref 30.0–36.0)
MCHC: 33.6 g/dL (ref 30.0–36.0)
MCHC: 34 g/dL (ref 30.0–36.0)
MCHC: 33.5 g/dL (ref 30.0–36.0)
MCV: 86.1 fL (ref 78.0–100.0)
MCV: 86.4 fL (ref 78.0–100.0)
MCV: 86.5 fL (ref 78.0–100.0)
MCV: 85.7 fL (ref 78.0–100.0)
Platelets: 149 10*3/uL — ABNORMAL LOW (ref 150–400)
Platelets: 156 10*3/uL (ref 150–400)
Platelets: 157 10*3/uL (ref 150–400)
Platelets: 175 10*3/uL (ref 150–400)
Platelets: 187 10*3/uL (ref 150–400)
RBC: 3.57 MIL/uL — ABNORMAL LOW (ref 3.87–5.11)
RBC: 3.58 MIL/uL — ABNORMAL LOW (ref 3.87–5.11)
RBC: 3.87 MIL/uL (ref 3.87–5.11)
RBC: 4 MIL/uL (ref 3.87–5.11)
RBC: 4.18 MIL/uL (ref 3.87–5.11)
RBC: 4.23 MIL/uL (ref 3.87–5.11)
RBC: 4.45 MIL/uL (ref 3.87–5.11)
RDW: 14.1 % (ref 11.5–15.5)
RDW: 14.4 % (ref 11.5–15.5)
RDW: 14.4 % (ref 11.5–15.5)
RDW: 14.2 % (ref 11.5–15.5)
RDW: 14.3 % (ref 11.5–15.5)
WBC: 11.8 10*3/uL — ABNORMAL HIGH (ref 4.0–10.5)
WBC: 14.6 10*3/uL — ABNORMAL HIGH (ref 4.0–10.5)
WBC: 15.9 10*3/uL — ABNORMAL HIGH (ref 4.0–10.5)
WBC: 19.5 10*3/uL — ABNORMAL HIGH (ref 4.0–10.5)
WBC: 9.3 10*3/uL (ref 4.0–10.5)

## 2011-03-07 LAB — POCT CARDIAC MARKERS
CKMB, poc: <1 ng/mL — ABNORMAL LOW (ref 1.0–8.0)
Myoglobin, poc: 72.7 ng/mL (ref 12–200)

## 2011-03-07 LAB — TSH: TSH: 2.838 u[IU]/mL (ref 0.350–4.500)

## 2011-03-07 LAB — COMPREHENSIVE METABOLIC PANEL
ALT: 13 U/L (ref 0–35)
Alkaline Phosphatase: 54 U/L (ref 39–117)
BUN: 16 mg/dL (ref 6–23)
CO2: 28 mEq/L (ref 19–32)
GFR calc non Af Amer: 58 mL/min — ABNORMAL LOW (ref >60)
Glucose, Bld: 104 mg/dL — ABNORMAL HIGH (ref 70–99)
Potassium: 4.3 mEq/L (ref 3.5–5.1)
Sodium: 137 mEq/L (ref 135–145)
Total Protein: 7.3 g/dL (ref 6.0–8.3)

## 2011-03-07 LAB — LIPID PANEL
Cholesterol: 155 mg/dL (ref 0–200)
LDL Cholesterol: 77 mg/dL (ref 0–99)

## 2011-03-07 LAB — HEPATIC FUNCTION PANEL
AST: 24 U/L (ref 0–37)
Bilirubin, Direct: <0.1 mg/dL (ref 0.0–0.3)
Total Bilirubin: 0.5 mg/dL (ref 0.3–1.2)

## 2011-03-07 LAB — CULTURE, BLOOD (ROUTINE X 2): Culture: NO GROWTH

## 2011-03-07 LAB — MAGNESIUM: Magnesium: 2.1 mg/dL (ref 1.5–2.5)

## 2011-03-07 LAB — URINE CULTURE
Colony Count: 25000
Colony Count: NO GROWTH
Culture: NO GROWTH

## 2011-03-07 LAB — URINALYSIS, ROUTINE W REFLEX MICROSCOPIC
Glucose, UA: NEGATIVE mg/dL
Hgb urine dipstick: NEGATIVE
Ketones, ur: NEGATIVE mg/dL
Protein, ur: NEGATIVE mg/dL
pH: 7 (ref 5.0–8.0)

## 2011-03-07 LAB — POCT I-STAT, CHEM 8
BUN: 22 mg/dL (ref 6–23)
Calcium, Ion: 1.22 mmol/L (ref 1.12–1.32)
Chloride: 104 mEq/L (ref 96–112)
Creatinine, Ser: 1 mg/dL (ref 0.4–1.2)
Glucose, Bld: 103 mg/dL — ABNORMAL HIGH (ref 70–99)
TCO2: 28 mmol/L (ref 0–100)

## 2011-03-07 LAB — VANCOMYCIN, TROUGH: Vancomycin Tr: 6 ug/mL — ABNORMAL LOW (ref 10.0–20.0)

## 2011-03-07 LAB — LIPASE, BLOOD
Lipase: 14 U/L (ref 11–59)
Lipase: 17 U/L (ref 11–59)

## 2011-03-07 LAB — BRAIN NATRIURETIC PEPTIDE: Pro B Natriuretic peptide (BNP): 112 pg/mL — ABNORMAL HIGH (ref 0.0–100.0)

## 2011-03-07 LAB — APTT: aPTT: 31 seconds (ref 24–37)

## 2011-03-08 LAB — POCT I-STAT, CHEM 8
BUN: 21 mg/dL (ref 6–23)
Calcium, Ion: 1.1 mmol/L — ABNORMAL LOW (ref 1.12–1.32)
Creatinine, Ser: 0.9 mg/dL (ref 0.4–1.2)
Hemoglobin: 13.6 g/dL (ref 12.0–15.0)
Sodium: 137 mEq/L (ref 135–145)
TCO2: 27 mmol/L (ref 0–100)

## 2011-03-08 LAB — URINALYSIS, ROUTINE W REFLEX MICROSCOPIC
Glucose, UA: NEGATIVE mg/dL
Protein, ur: NEGATIVE mg/dL
Specific Gravity, Urine: 1.004 — ABNORMAL LOW (ref 1.005–1.030)
Urobilinogen, UA: 0.2 mg/dL (ref 0.0–1.0)

## 2011-03-08 LAB — DIFFERENTIAL
Eosinophils Absolute: 0.1 10*3/uL (ref 0.0–0.7)
Eosinophils Relative: 1 % (ref 0–5)
Lymphocytes Relative: 26 % (ref 12–46)
Lymphs Abs: 1.8 10*3/uL (ref 0.7–4.0)
Monocytes Absolute: 0.7 10*3/uL (ref 0.1–1.0)

## 2011-03-08 LAB — CBC
HCT: 37.6 % (ref 36.0–46.0)
MCV: 86.5 fL (ref 78.0–100.0)
Platelets: 187 10*3/uL (ref 150–400)
RDW: 14.7 % (ref 11.5–15.5)

## 2011-03-08 LAB — URINE MICROSCOPIC-ADD ON

## 2011-03-11 LAB — POCT URINALYSIS DIP (DEVICE)
Nitrite: NEGATIVE
Protein, ur: 30 mg/dL — AB
Urobilinogen, UA: 0.2 mg/dL (ref 0.0–1.0)
pH: 5.5 (ref 5.0–8.0)

## 2011-03-12 LAB — POCT I-STAT, CHEM 8
Calcium, Ion: 1.22 mmol/L (ref 1.12–1.32)
Glucose, Bld: 93 mg/dL (ref 70–99)
HCT: 43 % (ref 36.0–46.0)
Hemoglobin: 14.6 g/dL (ref 12.0–15.0)
TCO2: 31 mmol/L (ref 0–100)

## 2011-03-12 LAB — POCT URINALYSIS DIP (DEVICE)
Glucose, UA: NEGATIVE mg/dL
Nitrite: NEGATIVE
Protein, ur: 100 mg/dL — AB
Specific Gravity, Urine: 1.025 (ref 1.005–1.030)
Urobilinogen, UA: 0.2 mg/dL (ref 0.0–1.0)

## 2011-04-14 NOTE — Op Note (Signed)
NAMELOUAN, BASE NO.:  [id number]   MEDICAL RECORD NO.:  [id number]          PATIENT TYPE:  INP   LOCATION:  3172                         FACILITY:  [facility]   PHYSICIAN:  Donalee Citrin, M.D.        DATE OF BIRTH:  [birth year]   DATE OF PROCEDURE:  07/17/2009  DATE OF DISCHARGE:                               OPERATIVE REPORT   PREOPERATIVE DIAGNOSIS:  Right frontal subdural hematoma.   DISCHARGE DIAGNOSIS:  Right frontal chronic subdural hematoma.   PROCEDURE:  Right frontal bur hole evacuation of right frontal subdural  hematoma.   SURGEON:  Donalee Citrin, MD   ANESTHESIA:  MAC with local.   HISTORY OF PRESENT ILLNESS:  The patient is a very pleasant 77 year old  female who presented to the emergency room with some blood pressure  lability and some balance difficulty.  CT scan showed a large subacute  to chronic right frontal subdural hematoma.  The patient was stabilized  medically on the floor and observed her serial CTs, and I recommended  the OR for a bur hole evacuation of right frontal subdural hematoma.  Risks and benefits of the operation were explained to the patient and  her family and they understood and agreed to proceed forth.   PROCEDURE IN DETAIL:  The patient was brought in the OR and was given  some light sedation.  Her right frontal area was shaved, prepped and  draped in usual sterile fashion, and was infiltrated with 5 mL of  lidocaine with epi.  An incision was made and the mastoid retractor was  placed.  A single bur hole was placed in the right frontal area.  The  dura was then identified and coagulated.  A cruciate incision was made  and then immediately, a subdural membrane was visualized, and this also  was coagulated and incised with 11-blade scalpel.  At that point, a very  dark liquid blood immediately came out under pressure.  This was all  aspirated.  Then, another deeper subdural membrane was visualized.  This  was coagulated and  incised with 11-blade scalpel and nerve hook was used  to free that membrane up until cortical surface was visualized, and at  this point, a red rubber catheter was used to further irrigate out the  right frontal subdural compartment and after all the irrigant that was  coming back was clear, at this point, Gelfoam was overlaid on top of the  dura.  The galea was closed with interrupted Vicryl, and the skin was  closed with staples.  At the end of the case, all needle count and  sponge were correct.  The patient was then sent to recovery in the ICU  in stable condition.           ______________________________  Donalee Citrin, M.D.     GC/MEDQ  D:  07/17/2009  T:  07/17/2009  Job:  [id number]

## 2011-04-14 NOTE — Consult Note (Signed)
NAMESYBIL, SHRADER NO.:  [id number]   MEDICAL RECORD NO.:  [id number]          PATIENT TYPE:  EMS   LOCATION:  MAJO                         FACILITY:  [facility]   PHYSICIAN:  Donalee Citrin, M.D.        DATE OF BIRTH:  05-08-1916   DATE OF CONSULTATION:  07/13/2009  DATE OF DISCHARGE:                                 CONSULTATION   REASON FOR CONSULTATION:  Subacute chronic right frontal subdural  hematoma.   HISTORY OF PRESENT ILLNESS:  The patient is a 77 year old female who is  apparently followed by [facility] who has had some difficulty with  blood pressure lability over the last few weeks.  The patient has been  managed with both some home care as well as some medication changes.  Some of the medication fluctuations have attributed as the cause of some  dizziness.  She denies having any headache.  She denies any numbness in  her arms or her legs.  She had one episode of left arm numbness.  The  other day that was just transient.  She denies any nausea and vomiting.  The patient came to the emergency department and on admission to the ER,  had a blood pressure of 181/61 and underwent a CT of her head and CT  showed the subacute subdural and I have been called to evaluate.   PAST MEDICAL HISTORY:  Remarkable for anemia, anxiety, cholesterolemia,  hypertension, arthritis, and peripheral vascular disease.   SURGICAL HISTORY:  Noncontributory.   She has no known allergies.   CURRENT MEDICATION:  Norvasc, aspirin, Colace, Lipitor, Neurontin,  Xalatan, valproic acid, clonidine and Lopressor.   PHYSICAL EXAMINATION:  GENERAL:  An awake, alert and oriented 89-year-  old female in no acute distress.  HEENT:  Within normal limits.  Pupils are equal, round and reactive to  light.  Extraocular movements are intact.  NEUROLOGIC:  Cranial nerves are otherwise intact.  Strength is 5/5 in  her upper and lower extremities.  She has no pronator drift.  She has  normal  sensation and reflexes bilaterally and she is completely  oriented.   Her CT scan does show about 1.5 cm mixed density subdural hematoma with  small amount of acute on subacute on chronic hemorrhage with some  flattening of the right frontal lobe, but very minimal mass effect with  a very minimal midline shift and minimal effacement of the lateral  ventricle.  This does appear to have some septations of membranes that  is implied that it is been around for a long time.   ASSESSMENT/PLAN:  This is a 77 year old female with a subacute to  chronic subdural hematoma.  It is possible that this is the source of  her blood pressure lability.  It does not appear to be symptomatic for  her in any other way.  She has no headaches, nausea, or vomiting.  She  reports dizziness that also could be related to the multiple changes in  blood pressure medications, in fact that will be difficult to determine  and at 77 years  of age, any kind of surgical procedure has significant  risk especially when talking about an intracranial procedure however,  she will be fine over the next couple of days that her blood pressure  lability is difficult to manage and that her dizziness is persistent and  that her brain is worsening progressively, swollen as a result to this  blood accumulation then it is possible that a single burr hole done  under MAC and local could alleviate some of the pressure with minimal  risk to her.  I explained all this to her, her son and her son's wife  and they understand clearly in general if this were asymptomatic.  We  will probably observe this as with the 77 years of age and extensive  cortical atrophy she has in the membranes, there is no way we will be  able to get it all out and it will have a significant risk of  reaccumulating.  I would recommend bringing her to hospital, she is  being apparently evaluated and admitted by [facility], place her on some  steroids and I recommend  Decadron 4 mg IV q.6 for 24 hours and then  transition over to a Medrol Dosepak to help with the brain swelling.  In  addition, stop her aspirin and check out another CAT scan on Monday  morning and make a decision as to whether burr hole evacuation of this  would be in her best interest.           ______________________________  Donalee Citrin, M.D.     GC/MEDQ  D:  07/13/2009  T:  07/13/2009  Job:  [id number]

## 2011-04-14 NOTE — Discharge Summary (Signed)
[name], [name] NO.:  [id number]   MEDICAL RECORD NO.:  [id number]          PATIENT TYPE:  INP   LOCATION:  3004                         FACILITY:  [facility]   PHYSICIAN:  Hillery Aldo, M.D.   DATE OF BIRTH:  02/26/1916   DATE OF ADMISSION:  07/13/2009  DATE OF DISCHARGE:  07/30/2009                               DISCHARGE SUMMARY   PRIMARY CARE PHYSICIAN:  Florentina Jenny, MD   DISCHARGE DIAGNOSES:  1. Right frontal subdural hematoma status post right frontal bur hole      evacuation.  2. Acute pulmonary embolism in the right lower lobe status post      Greenfield filter placement.  3. Right lower lobe pneumonia.  4. Urinary tract infection, cultures positive for Proteus mirabilis.  5. Hypoxia.  6. Hypertension.  7. Acute renal failure, resolved.  8. Anxiety.  9. Dyslipidemia.  10.Osteoarthritis.  11.Peripheral vascular disease.  12.Hearing impairment.  13.Chronic obstructive pulmonary disease.  14.Mild normocytic anemia.   DISCHARGE MEDICATIONS:  1. Amlodipine increased to 10 mg p.o. daily.  2. Lopressor decreased to 25 mg p.o. b.i.d.  3. Avelox 400 mg daily x2 more days.  4. Colace 100 mg p.o. b.i.d.  5. Lipitor 20 mg p.o. q.p.m.  6. Neurontin 300 mg p.o. q.p.m.  7. Xalatan 0.005% 1 drop in each eye at bedtime.  8. Valproic acid 500 mg p.o. at bedtime.  9. Caltrate 600 mg p.o. t.i.d.   CONSULTATIONS:  Donalee Citrin, MD, Neurosurgery.   BRIEF ADMISSION HISTORY OF PRESENT ILLNESS:  The patient is a 77-year-  old female who presented to the hospital with a chief complaint of  labile blood pressure.  She also reported some intermittent headaches  and upon initial evaluation in the emergency department, was noted to  have a systolic blood pressure of 218.  A CT scan of her head was  subsequently done which showed a subdural hematoma and she was referred  to the hospitalist service for evaluation.  For the full details, please  see my dictated H and  P.   PROCEDURES AND DIAGNOSTIC STUDIES:  For complete list of the procedures  and diagnostic studies through July 23, 2009, please see the dictated  progress note done by Dr. Arthor Captain.   ADDITIONAL PROCEDURES AND DIAGNOSTIC STUDIES:  1. KUB on July 24, 2009 showed no acute abdominal findings.      Residual contrast from recent CT.  2. Portable chest x-ray on July 24, 2009 showed worsening pleural      effusions and bibasilar atelectasis.  3. Abdominal ultrasound on July 25, 2009 showed no gallstones.  No      acute abdominal process.  Bilateral renal cysts.  Small bilateral      pleural effusions.  4. Greenfield filter placement done by Interventional Radiology on      July 26, 2009 by Dr. Lowella Dandy.  5. Chest x-ray on July 28, 2009 showed increased right pleural      effusion and right basilar atelectasis.  Cardiomegaly with      underlying chronic obstructive pulmonary disease.   DISCHARGE  LABORATORY VALUES:  BNP was 165.  Sodium was 139, potassium  3.7, chloride 105, bicarb 29, glucose 96, BUN 6, creatinine 0.67, and  calcium 8.2.  White blood cell count was 9.9, hemoglobin 9.7, hematocrit  29.3, and platelets 257.  Blood cultures were negative.  Urine cultures  grew 25,000 colonies of Proteus mirabilis.   HOSPITAL COURSE:  1. Subdural hematoma:  The patient was admitted to the neurosurgical      floor and monitored closely.  She had serial CT scans of the head      which were stable.  The patient was taken to the OR on July 18, 2009 for bur hole evacuation.  She remained stable with stable CT      scan status post this procedure.  2. Acute pulmonary embolism:  The patient had a CT chest ordered      secondary to concerns for pneumonia because of fever and persistent      right upper quadrant pain.  The CT findings were positive for      pulmonary embolism.  The patient was felt to be a poor      anticoagulation candidate secondary to her recent subdural       hematoma.  She was put on supplemental oxygen to keep her oxygen      saturation greater than 90% and pain control was provided.  She      ultimately underwent a Greenfield filter placement as noted above.  3. Right lower lobe pneumonia:  The patient was initially put on      Rocephin and azithromycin.  Her antibiotic coverage was broadened      to vancomycin and Zosyn because of her inpatient hospital stay.  At      this point, she has completed 5 days of treatment and will receive      additional 2 days of Lovenox.  Her fever has resolved and her white      blood cell count has normalized.  4. Hypoxia:  The patient's hypoxia was secondary to her pulmonary      embolism and underlying pneumonia of pleural effusion.  The patient      was provided with supplemental oxygen as needed.  5. Hypertension:  The patient's blood pressure has been reasonably      well controlled on the regimen as outlined above.  She may need up-      titration of her metoprolol or re-addition of her clonidine if her      blood pressure becomes uncontrolled as an outpatient.  6. Acute renal failure:  The patient's renal failure resolved.  7. Anxiety:  The patient does have some mild anxiety and given      appropriate reassurance as needed.  8. Dyslipidemia:  The patient was maintained on statin therapy.  9. Osteoarthritis:  The patient was provided with pain medications as      needed.  10.Peripheral vascular disease:  The patient's peripheral vascular      disease was stable.  11.Osteopenia:  The patient was maintained on calcium supplementation.  12.Normocytic anemia:  The patient's hemoglobin and hematocrit have      remained stable.   DISPOSITION:  The patient is approaching medical stability with plans to  be discharged [facility] in the morning.   CONDITION ON DISCHARGE:  Improved.      Hillery Aldo, M.D.  Electronically Signed     CR/MEDQ  D:  07/29/2009  T:  07/30/2009  Job:  [id number]   cc:    Florentina Jenny, M.D.

## 2011-04-14 NOTE — H&P (Signed)
[name], [name] NO.:  [id number]   MEDICAL RECORD NO.:  [id number]          PATIENT TYPE:  INP   LOCATION:  1825                         FACILITY:  [facility]   PHYSICIAN:  Hillery Aldo, M.D.   DATE OF BIRTH:  [birth year]   DATE OF ADMISSION:  07/13/2009  DATE OF DISCHARGE:                              HISTORY & PHYSICAL   PRIMARY CARE PHYSICIAN:  Dr. Florentina Jenny.   CHIEF COMPLAINT:  Labile blood pressure.   HISTORY OF PRESENT ILLNESS:  The patient is a 77 year old female with a  3-week history of difficult to control blood pressure.  She has had some  intermittently associated headaches, but complains of only a slight  headache at this time.  She denies any visual changes, focal weakness,  or changes in sensation.  She denies any recent falls.  Because of  marked elevation of her blood pressure, the patient was sent from her  assisted living facility to the hospital for further evaluation.  Upon  initial presentation, she was noted to have marked elevation of her  systolic blood pressure of 218 and subsequently, a CT scan of her head  showed a subdural hematoma.  He subsequently was referred to the  Hospitalist Service for evaluation.   PAST MEDICAL HISTORY:  1. Hypertension.  2. History of anemia.  3. History of anxiety.  4. Dyslipidemia.  5. Osteoarthritis.  6. Peripheral vascular disease.  7. Hearing impairment.  8. Osteopenia.   PAST SURGICAL HISTORY:  1. Hysterectomy.  2. Hernia repair.   FAMILY HISTORY:  The patient's mother died at age 29 from a stroke.  The  patient's father died at age 42 from bone cancer.  She has 6 siblings.  Four are deceased.  Three died from cancers.  One died of a stroke.  She  has 7 offsprings.  Three died from cancer of the stomach, breast, and  liver.   SOCIAL HISTORY:  The patient is widowed.  She lives at [facility]  She is a lifelong nonsmoker.  She denies  alcohol or drug  use.  She is retired from farm work, and did Tree surgeon  work as well.  She is a Engineer, agricultural.   ALLERGIES:  No known drug allergies.   CURRENT MEDICATIONS:  1. Norvasc 2.5 mg p.o. daily.  2. Aspirin 81 mg p.o. daily.  3. Colace 100 mg p.o. b.i.d.  4. Lipitor 20 mg p.o. q.p.m.  5. Neurontin 300 mg p.o. at bedtime.  6. Xalatan 0.005% 1 drop in each eye at bedtime.  7. Valproic acid 500 mg p.o. daily.  8. Clonidine 0.1 mg p.o. t.i.d.  9. Lopressor 25 mg p.o. t.i.d.  10.Caltrate 600 mg p.o. t.i.d.   REVIEW OF SYSTEMS:  CONSTITUTIONAL:  The patient denies any fever or  chills.  She has not had any significant weight changes.  She does have  diminished energy.  EYES:  The patient wears glasses, but complains of  no visual complaints.  ENT:  The patient is hard of hearing and wears  bilateral hearing aids.  She denies any pharyngitis.  She denies any  dysphagia.  CARDIOVASCULAR:  The patient denies chest pain or  arrhythmia.  She has no lower extremity edema.  RESPIRATORY:  The  patient denies dyspnea and cough.  GI:  The patient denies nausea,  vomiting, or changes in her bowel habits.  There has been no melena or  hematochezia.  GENITOURINARY:  The patient denies dysuria and hematuria.  MUSCULOSKELETAL:  The patient does have some chronic right shoulder pain  secondary to osteoarthritis.  Otherwise, no significant myalgias.  SKIN:  No complaints.  NEUROLOGIC:  The patient has had intermittent headaches  as described above.  No history of seizures.  ENDOCRINE:  No complaints.  LYMPHATICS:  No complaints.  PSYCHIATRIC:  The patient admits to  depression and anxiety.   PHYSICAL EXAMINATION:  VITAL SIGNS:  Temperature 97.8, pulse 68,  respirations 22, blood pressure 218/77, O2 saturation 98% on room air.  GENERAL:  Well-developed, well-nourished female in no acute distress.  EYES:  Conjunctivae are noninjected.  Pupils are equally round, reactive  to light and accommodation.   Extraocular movements are intact.  ENT:  Right tympanic membrane is impacted with cerumen.  Left is normal.  Oropharynx is clear.  Dentition is fair.  Nasal mucosa is moist.  Decreased hearing acuity bilaterally.  NECK:  Supple.  No thyromegaly.  RESPIRATORY:  Lungs are clear to auscultation bilaterally with good air  movement.  HEART:  Regular rate and rhythm.  No murmurs, rubs, or gallops.  No  appreciable pedal edema.  No JVD.  Pedal pulses are 1+.  GI:  Abdomen is soft, nontender, nondistended with no organomegaly.  RECTAL:  Deferred.  LYMPHATICS:  Palpable lymphadenopathy in the neck, axilla, or groin.  MUSCULOSKELETAL:  Normal to inspection.  Good range of motion.  SKIN:  Normal to inspection.  No rashes.  NEUROLOGIC:  Cranial nerves II through XII are grossly intact.  DTRs are  1+ and symmetric.  Sensation is intact.  PSYCHIATRIC:  The patient's affect is normal.  She is alert and oriented  x3.  Fund of memory appears intact.   DATA REVIEW:  CT scan of the head shows subacute right subdural hematoma  with trace left shift.   Chest x-ray shows no acute cardiopulmonary abnormalities.   EKG shows normal sinus rhythm with left axis deviation.   LABORATORY DATA:  Sodium is 140, potassium 4.9, chloride 104, bicarb 28,  BUN 22, creatinine 1.0, glucose 103.  Hemoglobin is 12.2, hematocrit 36.  Ionized calcium is 1.22.  Coagulation studies are within normal limits.  Urinalysis is negative for signs of infection.   ASSESSMENT AND PLAN:  1. Subacute subdural hematoma likely due to accelerated hypertension:      We will admit the patient to the telemetry unit and bring her blood      pressure down with medications.  The patient was seen by Dr. Wynetta Emery      of Neurosurgery, who recommended Decadron 4 mg IV q.6 h x24 hours      and a Medrol Dosepak.  The patient will have serial neuro checks.      We will repeat a CT scan tomorrow, and if no improvement, Dr. Wynetta Emery      will consider  placement of a bur hole.  At this time, we will      increase her Norvasc and clonidine, and if these continue to be      ineffective in bringing her blood pressure down, the  patient may      need to be put on a labetalol IV drip for continuous treatment.  2. Accelerated hypertension:  We will increase the patient's Norvasc      10 mg daily.  We will increase clonidine to 0.2 mg t.i.d..  We will      continue Lopressor at 25 mg t.i.d.  We will add a diuretic and      monitor her renal function closely.  3. Dyslipidemia:  Continue the patient's statin therapy.  4. Osteoarthritis:  Provide the patient with pain medication as      needed.  5. Peripheral vascular disease:  We will hold the patient's aspirin      secondary to her subacute hematoma for now.  6. Osteopenia:  We will continue the patient's calcium supplements.  7. Prophylaxis:  We will use pulsatile antiembolic sequential hoses      for deep venous thrombosis prophylaxis.  No current indication for      gastrointestinal stress ulcer prophylaxis as the patient is not      critically ill.   Time spent gathering data, analyzing data, interviewing the patient,  examining the patient, and formulating plan of care approximately equal  to 1 hour 10 minutes.      Hillery Aldo, M.D.  Electronically Signed     CR/MEDQ  D:  07/13/2009  T:  07/13/2009  Job:  [id number]   cc:   Florentina Jenny, MD

## 2011-04-14 NOTE — Group Therapy Note (Signed)
[name], [name] NO.:  [id number]   MEDICAL RECORD NO.:  [id number]          PATIENT TYPE:  INP   LOCATION:  3004                         FACILITY:  [facility]   PHYSICIAN:  Audrey Gardener, MD    DATE OF BIRTH:  [birth year]                                 PROGRESS NOTE   DISCHARGE DIAGNOSES:  1. Right frontal subdural hematoma status post right frontal bur hole      evacuation.  2. Acute pulmonary embolism in the right lower lobe.  3. Right lower lobe pneumonia.  4. Urinary tract infection with proteins.  5. Hypoxia secondary to #1 and #2.  6. Right upper quadrant abdominal pain secondary to referral pain from      pulmonary embolism and pneumonia.  7. Hypertension.  8. Acute renal failure that resolved.  9. Anxiety.  10.Dyslipidemia.  11.Osteoarthritis.  12.Peripheral vascular disease.  13.Hearing impairment.   DISCHARGE MEDICATIONS:  To be determined by the discharging physician.   CONSULTATION:  Neurosurgical consult.   PROCEDURES:  Right frontal bur hole evacuation of the right frontal subdural  hematoma.   DIAGNOSTIC STUDIES:  1. CT scan of the head without contrast on August 14 showed subacute      right subdural hematoma measuring 11-19 mm.  2. Chest x-ray on August 14 showed no acute problem.  3. CT scan of the head without contrast on August 15 showed stable      right subdural hematoma with moderate mass effect.  4. Chest x-ray on August 17 showed no acute abnormality.  5. CT scan of the head without contrast on August 19 showed status      post drainage of the right-sided subdural hematoma with air      contained within the anterior frontal subdural space, continues to      cause mild mass effect on the right frontal lobe and a slight      decrease in overall size of subdural collection posteriorly.  6. Repeat CAT scan on August 20 showed a slight apparent decrease in      the overall size of the left subdural hematoma.  7. Abdominal  ultrasound on August 21 showed a tiny amount of sludge      within the gallbladder with no gallstones change and no evidence of      cholecystitis.  8. Abdominal x-ray on August 22 showed nonobstructive bowel gas      pattern.  9. Chest x-ray on August 22 showed a small pleural effusion and      bibasilar atelectasis with infiltrate and questionable right upper      lobe nodule.  10.CT scan of the head without contrast on August 23 showed interval      slight decrease in the right frontal parietal subdural hematoma.      There is no evidence of interval re-bleeding or increased mass      effect.  11.CT scan of the chest with contrast on August 23 showed suspicion of      right lower lobe pulmonary embolism with decreased perfusion to the  collapse right lower lobe, right greater than left, atelectasis or      pneumonia, right greater than left pleural effusion and no evidence      of pulmonary nodule.  12.CT scan of the abdomen with contrast on August 23 showed no acute      findings.  13.CT scan of the pelvis with contrast on August 23 showed no acute      findings and showed prominent right  ovarian cyst.   COURSE OF HOSPITALIZATION:  1. Subdural hematoma.  This patient was admitted to the hospital to      the neurosurgical floor.  Neurosurgery was consulted.  The patient      was followed with serial CAT scans of the head as dictated above.      The patient was taken to the OR on August 19 and bur hole      evaluation was done.  Following that, she had repeat CAT scan on      August 23 and results were mentioned above.  This current matter is      stable and neurosurgery continue to follow.  2. Acute pulmonary embolism.  CT scan of the chest on August 23 showed      acute pulmonary embolism.  The patient was continued on oxygen to      keep saturation above 90.  Symptomatic treatment with pain      medications and nebulizer treatments will be provided.      Unfortunately, the  patient is not a good candidate for      anticoagulation with her recurrent falls and her current subdural      hematoma.  3. Right lower lobe pneumonia.  The patient is currently on Rocephin      and Zithromax.  She is afebrile with normal white count.      Antibiotics could be discontinued in the next 2 or 3 days pending      her clinical condition.  4. Hypoxia secondary to her pulmonary embolism and pneumonia.  The      patient was provided with oxygen to keep saturation above 90.  5. Right upper quadrant pain.  This is a referral pain from her      pulmonary embolism and pneumonia.  This pain has been worked out      where she had amylase, lipase, liver function tests, abdominal      ultrasound, CT scan of the abdomen and pelvis, and chest and      abdominal x-ray and all those came to be negative.  We will      continue to treat that with pain medications as needed.  6. Hypertension.  The patient is currently on metoprolol.  We will      continue to follow blood pressure and adjust medications as needed.  7. Acute renal failure of prerenal component that was treated with IV      fluids and it resolved.   The patient was evaluated by physical therapy.  She might need to go to  a skilled nursing facility rather than her assisted-living facility.  Currently, we are trying to control her oxygen saturation and to control  her pain, the pain from the PE.  An addendum will be dictated by the  discharging physician at the time of discharge.   Total assessment time is 40 minutes.      Audrey Gardener, MD  Electronically Signed     NAE/MEDQ  D:  07/23/2009  T:  07/23/2009  Job:  [id number]

## 2011-05-19 ENCOUNTER — Encounter (HOSPITAL_COMMUNITY): Payer: Self-pay

## 2011-05-19 ENCOUNTER — Emergency Department (HOSPITAL_COMMUNITY)
Admission: EM | Admit: 2011-05-19 | Discharge: 2011-05-19 | Disposition: A | Discharge status: Home / Self Care | Payer: Medicare HMO | Attending: Emergency Medicine | Admitting: Emergency Medicine

## 2011-05-19 ENCOUNTER — Emergency Department (HOSPITAL_COMMUNITY): Payer: Medicare HMO

## 2011-05-19 DIAGNOSIS — G319 Degenerative disease of nervous system, unspecified: Secondary | ICD-10-CM | POA: Insufficient documentation

## 2011-05-19 DIAGNOSIS — T1490XA Injury, unspecified, initial encounter: Secondary | ICD-10-CM | POA: Insufficient documentation

## 2011-05-19 DIAGNOSIS — R799 Abnormal finding of blood chemistry, unspecified: Secondary | ICD-10-CM | POA: Insufficient documentation

## 2011-05-19 DIAGNOSIS — M5030 Other cervical disc degeneration, unspecified cervical region: Secondary | ICD-10-CM | POA: Insufficient documentation

## 2011-05-19 DIAGNOSIS — E785 Hyperlipidemia, unspecified: Secondary | ICD-10-CM | POA: Insufficient documentation

## 2011-05-19 DIAGNOSIS — Y9210 Unspecified residential institution as the place of occurrence of the external cause: Secondary | ICD-10-CM | POA: Insufficient documentation

## 2011-05-19 DIAGNOSIS — I10 Essential (primary) hypertension: Secondary | ICD-10-CM | POA: Insufficient documentation

## 2011-05-19 DIAGNOSIS — W010XXA Fall on same level from slipping, tripping and stumbling without subsequent striking against object, initial encounter: Secondary | ICD-10-CM | POA: Insufficient documentation

## 2011-05-19 DIAGNOSIS — Z79899 Other long term (current) drug therapy: Secondary | ICD-10-CM | POA: Insufficient documentation

## 2011-05-19 LAB — BASIC METABOLIC PANEL
Calcium: 9.8 mg/dL (ref 8.4–10.5)
GFR calc Af Amer: >60 mL/min (ref >60)
GFR calc non Af Amer: 56 mL/min — ABNORMAL LOW (ref >60)
Glucose, Bld: 88 mg/dL (ref 70–99)
Sodium: 135 mEq/L (ref 135–145)

## 2011-11-20 ENCOUNTER — Other Ambulatory Visit: Payer: Self-pay

## 2011-11-20 ENCOUNTER — Encounter (HOSPITAL_COMMUNITY): Payer: Self-pay | Admitting: Emergency Medicine

## 2011-11-20 ENCOUNTER — Inpatient Hospital Stay (HOSPITAL_COMMUNITY)
Admission: EM | Admit: 2011-11-20 | Discharge: 2011-11-27 | DRG: 194 | Disposition: A | Discharge status: Home Health | Payer: Medicare HMO | Attending: Internal Medicine | Admitting: Internal Medicine

## 2011-11-20 ENCOUNTER — Emergency Department (HOSPITAL_COMMUNITY): Payer: Medicare HMO

## 2011-11-20 DIAGNOSIS — H409 Unspecified glaucoma: Secondary | ICD-10-CM | POA: Diagnosis present

## 2011-11-20 DIAGNOSIS — R2681 Unsteadiness on feet: Secondary | ICD-10-CM | POA: Diagnosis present

## 2011-11-20 DIAGNOSIS — R269 Unspecified abnormalities of gait and mobility: Secondary | ICD-10-CM | POA: Diagnosis present

## 2011-11-20 DIAGNOSIS — I472 Ventricular tachycardia, unspecified: Secondary | ICD-10-CM | POA: Diagnosis not present

## 2011-11-20 DIAGNOSIS — I2510 Atherosclerotic heart disease of native coronary artery without angina pectoris: Secondary | ICD-10-CM | POA: Diagnosis present

## 2011-11-20 DIAGNOSIS — I252 Old myocardial infarction: Secondary | ICD-10-CM

## 2011-11-20 DIAGNOSIS — Z7982 Long term (current) use of aspirin: Secondary | ICD-10-CM

## 2011-11-20 DIAGNOSIS — I4891 Unspecified atrial fibrillation: Secondary | ICD-10-CM | POA: Diagnosis present

## 2011-11-20 DIAGNOSIS — Z86711 Personal history of pulmonary embolism: Secondary | ICD-10-CM

## 2011-11-20 DIAGNOSIS — Z86718 Personal history of other venous thrombosis and embolism: Secondary | ICD-10-CM

## 2011-11-20 DIAGNOSIS — I4729 Other ventricular tachycardia: Secondary | ICD-10-CM | POA: Diagnosis not present

## 2011-11-20 DIAGNOSIS — M25552 Pain in left hip: Secondary | ICD-10-CM | POA: Diagnosis present

## 2011-11-20 DIAGNOSIS — H9190 Unspecified hearing loss, unspecified ear: Secondary | ICD-10-CM | POA: Diagnosis present

## 2011-11-20 DIAGNOSIS — I10 Essential (primary) hypertension: Secondary | ICD-10-CM | POA: Diagnosis present

## 2011-11-20 DIAGNOSIS — Z79899 Other long term (current) drug therapy: Secondary | ICD-10-CM

## 2011-11-20 DIAGNOSIS — M169 Osteoarthritis of hip, unspecified: Secondary | ICD-10-CM | POA: Diagnosis present

## 2011-11-20 DIAGNOSIS — M1610 Unilateral primary osteoarthritis, unspecified hip: Secondary | ICD-10-CM | POA: Diagnosis present

## 2011-11-20 DIAGNOSIS — J189 Pneumonia, unspecified organism: Secondary | ICD-10-CM | POA: Diagnosis present

## 2011-11-20 DIAGNOSIS — E46 Unspecified protein-calorie malnutrition: Secondary | ICD-10-CM | POA: Diagnosis present

## 2011-11-20 HISTORY — DX: Angina pectoris, unspecified: I20.9

## 2011-11-20 HISTORY — DX: Unspecified atrial fibrillation: I48.91

## 2011-11-20 HISTORY — DX: Atherosclerotic heart disease of native coronary artery without angina pectoris: I25.10

## 2011-11-20 HISTORY — DX: Acute embolism and thrombosis of unspecified deep veins of unspecified lower extremity: I82.409

## 2011-11-20 HISTORY — DX: Unspecified osteoarthritis, unspecified site: M19.90

## 2011-11-20 HISTORY — DX: Essential (primary) hypertension: I10

## 2011-11-20 HISTORY — DX: Traumatic subdural hemorrhage with loss of consciousness of unspecified duration, initial encounter: S06.5X9A

## 2011-11-20 HISTORY — DX: Acute myocardial infarction, unspecified: I21.9

## 2011-11-20 HISTORY — DX: Cardiac arrhythmia, unspecified: I49.9

## 2011-11-20 HISTORY — DX: Traumatic subdural hemorrhage with loss of consciousness status unknown, initial encounter: S06.5XAA

## 2011-11-20 LAB — URINALYSIS, ROUTINE W REFLEX MICROSCOPIC
Ketones, ur: 40 mg/dL — AB
Leukocytes, UA: NEGATIVE
Nitrite: NEGATIVE
Protein, ur: >300 mg/dL — AB
Urobilinogen, UA: 1 mg/dL (ref 0.0–1.0)

## 2011-11-20 LAB — BASIC METABOLIC PANEL
BUN: 16 mg/dL (ref 6–23)
Calcium: 9.4 mg/dL (ref 8.4–10.5)
Chloride: 99 mEq/L (ref 96–112)
Creatinine, Ser: 0.77 mg/dL (ref 0.50–1.10)
GFR calc Af Amer: 81 mL/min — ABNORMAL LOW (ref >90)
GFR calc non Af Amer: 70 mL/min — ABNORMAL LOW (ref >90)
Sodium: 136 mEq/L (ref 135–145)

## 2011-11-20 LAB — CBC
MCV: 87 fL (ref 78.0–100.0)
Platelets: 194 10*3/uL (ref 150–400)
RBC: 4.24 MIL/uL (ref 3.87–5.11)
RDW: 15.6 % — ABNORMAL HIGH (ref 11.5–15.5)
WBC: 16.1 10*3/uL — ABNORMAL HIGH (ref 4.0–10.5)

## 2011-11-20 LAB — URINE MICROSCOPIC-ADD ON

## 2011-11-20 LAB — PROCALCITONIN: Procalcitonin: 0.28 ng/mL

## 2011-11-20 LAB — LACTIC ACID, PLASMA: Lactic Acid, Venous: 1.1 mmol/L (ref 0.5–2.2)

## 2011-11-20 MED ORDER — VANCOMYCIN HCL IN DEXTROSE 1-5 GM/200ML-% IV SOLN
1000.0000 mg | Freq: Once | INTRAVENOUS | Status: AC
  Administered 2011-11-20: 1000 mg via INTRAVENOUS
  Filled 2011-11-20: qty 200

## 2011-11-20 MED ORDER — CEFEPIME HCL 2 G IJ SOLR
2.0000 g | Freq: Once | INTRAMUSCULAR | Status: DC
  Filled 2011-11-20: qty 2

## 2011-11-20 MED ORDER — SODIUM CHLORIDE 0.9 % IV BOLUS (SEPSIS)
1000.0000 mL | Freq: Once | INTRAVENOUS | Status: AC
  Administered 2011-11-20: 1000 mL via INTRAVENOUS

## 2011-11-20 MED ORDER — LEVOFLOXACIN IN D5W 750 MG/150ML IV SOLN
750.0000 mg | INTRAVENOUS | Status: DC
  Administered 2011-11-21: 750 mg via INTRAVENOUS

## 2011-11-20 MED ORDER — DEXTROSE 5 % IV SOLN
2.0000 g | Freq: Two times a day (BID) | INTRAVENOUS | Status: DC
  Administered 2011-11-20 – 2011-11-22 (×5): 2 g via INTRAVENOUS
  Filled 2011-11-20 (×6): qty 2

## 2011-11-20 MED ORDER — LEVOFLOXACIN IN D5W 750 MG/150ML IV SOLN
750.0000 mg | Freq: Once | INTRAVENOUS | Status: DC
  Filled 2011-11-20: qty 150

## 2011-11-20 MED ORDER — SODIUM CHLORIDE 0.9 % IV SOLN
Freq: Once | INTRAVENOUS | Status: AC
  Administered 2011-11-20: 23:00:00 via INTRAVENOUS

## 2011-11-20 NOTE — ED Notes (Signed)
PER EMS- Reports pt slid about 2 feet to the floor from her bed. Staff states pt was normal about 21:00 yesterday, but has been getting increasingly lethargic ever since. States left leg appears to be a little shorter. Pt is alert x4. 20 gauge IV in the hand.

## 2011-11-20 NOTE — ED Provider Notes (Addendum)
History     CSN: [id number]  Arrival date & time 11/20/11  2017   First MD Initiated Contact with Patient 11/20/11 2018      Chief Complaint  Patient presents with  . Fall    (Consider location/radiation/quality/duration/timing/severity/associated sxs/prior treatment) HPI Comments: Patient lives in a nursing home and accidentally slid off the edge of her bed onto the floor landing on her buttocks.  No loss of consciousness.  Patient denies any lightheadedness, dizziness, chest pain, shortness of breath or palpitations.  No nausea or vomiting.  Per EMS the patient had complained of left hip pain but to me she now denies any pain.  It is unclear she is attempted to ambulate since this incident but states that she does walk at baseline.  Patient is otherwise alert appropriate and knows the month and year and where she is at.  Per EMS the family noted the patient's been more lethargic in the last day although she is awake and alert at this time.  Patient was sent in likely per nursing home policy.  Patient is a 77 y.o. female presenting with fall. The history is provided by the patient and the EMS personnel. No language interpreter was used.  Fall The accident occurred less than 1 hour ago. She fell from a height of 1 to 2 ft. She landed on carpet. There was no blood loss. The patient is experiencing no pain. There was no entrapment after the fall. There was no drug use involved in the accident. There was no alcohol use involved in the accident. Pertinent negatives include no visual change, no fever, no numbness, no abdominal pain, no bowel incontinence, no nausea, no vomiting, no hematuria, no headaches, no hearing loss, no loss of consciousness and no tingling.    Past Medical History  Diagnosis Date  . Arthritis     History reviewed. No pertinent past surgical history.  No family history on file.  History  Substance Use Topics  . Smoking status: Never Smoker   . Smokeless tobacco:  Not on file  . Alcohol Use:     OB History    Grav Para Term Preterm Abortions TAB SAB Ect Mult Living                  Review of Systems  Constitutional: Negative.  Negative for fever and chills.  HENT: Negative.   Eyes: Negative.  Negative for discharge and redness.  Respiratory: Negative.  Negative for cough and shortness of breath.   Cardiovascular: Negative.  Negative for chest pain.  Gastrointestinal: Negative.  Negative for nausea, vomiting, abdominal pain, diarrhea and bowel incontinence.  Genitourinary: Negative.  Negative for dysuria, hematuria and vaginal discharge.  Musculoskeletal: Negative for back pain.  Skin: Negative.  Negative for color change and rash.  Neurological: Negative.  Negative for tingling, loss of consciousness, syncope, numbness and headaches.  Hematological: Negative.  Negative for adenopathy.  Psychiatric/Behavioral: Negative.  Negative for confusion.  All other systems reviewed and are negative.    Allergies  Review of patient's allergies indicates no known allergies.  Home Medications  No current outpatient prescriptions on file.  BP 140/84  Pulse 108  Temp(Src) 98 F (36.7 C) (Oral)  Resp 28  SpO2 96%  Physical Exam  Constitutional: She is oriented to person, place, and time. She appears well-developed and well-nourished.  Non-toxic appearance. She does not have a sickly appearance.  HENT:  Head: Normocephalic and atraumatic.  Eyes: Conjunctivae, EOM and lids are normal.  Pupils are equal, round, and reactive to light. No scleral icterus.  Neck: Trachea normal and normal range of motion. Neck supple.  Cardiovascular: Normal rate, regular rhythm and normal heart sounds.   Pulmonary/Chest: Effort normal and breath sounds normal. No respiratory distress. She has no wheezes. She has no rales.  Abdominal: Soft. Normal appearance. There is no tenderness. There is no rebound, no guarding and no CVA tenderness.  Musculoskeletal: Normal range  of motion.       Legs appeared to be the same length and there is no focal hip tenderness on palpation on either side  Neurological: She is alert and oriented to person, place, and time. She has normal strength.  Skin: Skin is warm, dry and intact. No rash noted.  Psychiatric: She has a normal mood and affect. Her behavior is normal. Judgment and thought content normal.    ED Course  Procedures (including critical care time)  Results for orders placed during the hospital encounter of 11/20/11  CBC      Component Value Range   WBC 16.1 (*) 4.0 - 10.5 (K/uL)   RBC 4.24  3.87 - 5.11 (MIL/uL)   Hemoglobin 12.3  12.0 - 15.0 (g/dL)   HCT 30.8  65.7 - 84.6 (%)   MCV 87.0  78.0 - 100.0 (fL)   MCH 29.0  26.0 - 34.0 (pg)   MCHC 33.3  30.0 - 36.0 (g/dL)   RDW 96.2 (*) 95.2 - 15.5 (%)   Platelets 194  150 - 400 (K/uL)  BASIC METABOLIC PANEL      Component Value Range   Sodium 136  135 - 145 (mEq/L)   Potassium 3.7  3.5 - 5.1 (mEq/L)   Chloride 99  96 - 112 (mEq/L)   CO2 25  19 - 32 (mEq/L)   Glucose, Bld 136 (*) 70 - 99 (mg/dL)   BUN 16  6 - 23 (mg/dL)   Creatinine, Ser 8.41  0.50 - 1.10 (mg/dL)   Calcium 9.4  8.4 - 32.4 (mg/dL)   GFR calc non Af Amer 70 (*) >90 (mL/min)   GFR calc Af Amer 81 (*) >90 (mL/min)  URINALYSIS, ROUTINE W REFLEX MICROSCOPIC      Component Value Range   Color, Urine AMBER (*) YELLOW    APPearance CLEAR  CLEAR    Specific Gravity, Urine 1.026  1.005 - 1.030    pH 5.5  5.0 - 8.0    Glucose, UA NEGATIVE  NEGATIVE (mg/dL)   Hgb urine dipstick LARGE (*) NEGATIVE    Bilirubin Urine SMALL (*) NEGATIVE    Ketones, ur 40 (*) NEGATIVE (mg/dL)   Protein, ur >401 (*) NEGATIVE (mg/dL)   Urobilinogen, UA 1.0  0.0 - 1.0 (mg/dL)   Nitrite NEGATIVE  NEGATIVE    Leukocytes, UA NEGATIVE  NEGATIVE   CARDIAC PANEL(CRET KIN+CKTOT+MB+TROPI)      Component Value Range   Total CK 68  7 - 177 (U/L)   CK, MB 1.3  0.3 - 4.0 (ng/mL)   Troponin I <0.30  <0.30 (ng/mL)   Relative  Index RELATIVE INDEX IS INVALID  0.0 - 2.5   URINE MICROSCOPIC-ADD ON      Component Value Range   WBC, UA 0-2  <3 (WBC/hpf)   RBC / HPF 11-20  <3 (RBC/hpf)   Bacteria, UA RARE  RARE    Urine-Other MUCOUS PRESENT     Dg Chest 2 View  11/20/2011  *RADIOLOGY REPORT*  Clinical Data: Chest pain.  CHEST -  2 VIEW  Comparison: 09/23/2010  Findings: Cardiomegaly is stable.  Bilateral lower lobe airspace disease is new since previous study, may be due to edema or pneumonia. No significant pleural effusion identified.  IMPRESSION:  1.  Bilateral lower lobe airspace disease, which may be due to pulmonary edema or multilobar pneumonia. 2.  Stable cardiomegaly.  Original Report Authenticated By: Danae Orleans, M.D.   Dg Hip Complete Left  11/20/2011  *RADIOLOGY REPORT*  Clinical Data: Fall.  Left hip injury and pain.  LEFT HIP - COMPLETE 2+ VIEW  Comparison: None.  Findings: No evidence of left hip fracture or dislocation.  No acetabular or pelvic fracture identified.  Mild left hip DJD is seen with subchondral geode in the femoral head.  Generalized osteopenia is noted as well as peripheral vascular calcification.  IMPRESSION:  1.  No acute findings. 2.  Mild left hip DJD.  Original Report Authenticated By: Danae Orleans, M.D.      Date: 11/20/2011  Rate: 112  Rhythm: atrial fibrillation  QRS Axis: normal  Intervals: QT prolonged  ST/T Wave abnormalities: nonspecific ST changes and nonspecific T wave changes  Conduction Disutrbances:left bundle branch block  Narrative Interpretation:   Old EKG Reviewed: unchanged from 09/23/2010     MDM  Patient with fall today with no acute findings for hip fracture.  Per EMS report family had noted patient was acting more tired than normal.  This prompted me to obtain further evaluation of this patient given her elderly status and that she is a nursing home patient.  Her chest x-ray has been found to show bilateral lower lobe airspace disease consistent with  possible multilobar pneumonia.  Patient also has an elevated white blood cell count that could correlate with this and showed some tachypnea but no hypoxia, hypotension or fever.  Patient will be given hospital-acquired antibiotics given that she is a nursing home patient.  I will order vancomycin, levofloxacin and cefepime for this patient.  Blood cultures have also been added.  I will contact the internal medicine team for admission of this patient        Nat Christen, MD 11/20/11 2221  I have discussed the diagnosis with both the patient and the patient's son who is at the bedside now.  He does endorse that the patient has been more tired and lethargic over the last day and less talkative than her normal self.  Patient also endorses that she's had some cough as well.  I have contacted Dr. Jarold Motto who is covering for the [facility] for admission of this patient for her pneumonia.  Nat Christen, MD 11/20/11 2300

## 2011-11-21 ENCOUNTER — Encounter (HOSPITAL_COMMUNITY): Payer: Self-pay | Admitting: Internal Medicine

## 2011-11-21 DIAGNOSIS — S065XAA Traumatic subdural hemorrhage with loss of consciousness status unknown, initial encounter: Secondary | ICD-10-CM | POA: Insufficient documentation

## 2011-11-21 DIAGNOSIS — I4891 Unspecified atrial fibrillation: Secondary | ICD-10-CM | POA: Diagnosis present

## 2011-11-21 DIAGNOSIS — I10 Essential (primary) hypertension: Secondary | ICD-10-CM | POA: Diagnosis present

## 2011-11-21 DIAGNOSIS — H9190 Unspecified hearing loss, unspecified ear: Secondary | ICD-10-CM | POA: Insufficient documentation

## 2011-11-21 DIAGNOSIS — H409 Unspecified glaucoma: Secondary | ICD-10-CM | POA: Insufficient documentation

## 2011-11-21 DIAGNOSIS — R2681 Unsteadiness on feet: Secondary | ICD-10-CM | POA: Diagnosis present

## 2011-11-21 DIAGNOSIS — J189 Pneumonia, unspecified organism: Secondary | ICD-10-CM | POA: Diagnosis present

## 2011-11-21 DIAGNOSIS — I2510 Atherosclerotic heart disease of native coronary artery without angina pectoris: Secondary | ICD-10-CM | POA: Insufficient documentation

## 2011-11-21 DIAGNOSIS — S065X9A Traumatic subdural hemorrhage with loss of consciousness of unspecified duration, initial encounter: Secondary | ICD-10-CM | POA: Insufficient documentation

## 2011-11-21 DIAGNOSIS — I2699 Other pulmonary embolism without acute cor pulmonale: Secondary | ICD-10-CM | POA: Insufficient documentation

## 2011-11-21 DIAGNOSIS — M25552 Pain in left hip: Secondary | ICD-10-CM | POA: Diagnosis present

## 2011-11-21 LAB — CREATININE, SERUM
Creatinine, Ser: 0.78 mg/dL (ref 0.50–1.10)
GFR calc non Af Amer: 70 mL/min — ABNORMAL LOW (ref >90)

## 2011-11-21 LAB — CBC
MCH: 28.2 pg (ref 26.0–34.0)
MCH: 28 pg (ref 26.0–34.0)
MCHC: 32.1 g/dL (ref 30.0–36.0)
MCV: 88.1 fL (ref 78.0–100.0)
Platelets: 187 10*3/uL (ref 150–400)
Platelets: 196 10*3/uL (ref 150–400)
RBC: 4.19 MIL/uL (ref 3.87–5.11)
RDW: 15.8 % — ABNORMAL HIGH (ref 11.5–15.5)

## 2011-11-21 LAB — COMPREHENSIVE METABOLIC PANEL
AST: 9 U/L (ref 0–37)
Albumin: 2.4 g/dL — ABNORMAL LOW (ref 3.5–5.2)
Calcium: 9.3 mg/dL (ref 8.4–10.5)
Creatinine, Ser: 0.81 mg/dL (ref 0.50–1.10)
Sodium: 138 mEq/L (ref 135–145)
Total Protein: 7 g/dL (ref 6.0–8.3)

## 2011-11-21 MED ORDER — ASPIRIN EC 325 MG PO TBEC
DELAYED_RELEASE_TABLET | ORAL | Status: AC
  Administered 2011-11-21: 01:00:00
  Filled 2011-11-21: qty 1

## 2011-11-21 MED ORDER — FUROSEMIDE 20 MG PO TABS
20.0000 mg | ORAL_TABLET | Freq: Every day | ORAL | Status: DC
  Administered 2011-11-21 – 2011-11-27 (×7): 20 mg via ORAL
  Filled 2011-11-21 (×7): qty 1

## 2011-11-21 MED ORDER — BIMATOPROST 0.01 % OP SOLN
1.0000 [drp] | Freq: Every day | OPHTHALMIC | Status: DC
  Filled 2011-11-21: qty 5

## 2011-11-21 MED ORDER — DIVALPROEX SODIUM 250 MG PO DR TAB
250.0000 mg | DELAYED_RELEASE_TABLET | ORAL | Status: DC
  Administered 2011-11-21 – 2011-11-27 (×4): 250 mg via ORAL
  Filled 2011-11-21 (×4): qty 1

## 2011-11-21 MED ORDER — ASPIRIN 325 MG PO TABS
325.0000 mg | ORAL_TABLET | Freq: Every day | ORAL | Status: DC
  Administered 2011-11-21 – 2011-11-27 (×7): 325 mg via ORAL
  Filled 2011-11-21 (×7): qty 1

## 2011-11-21 MED ORDER — ENOXAPARIN SODIUM 40 MG/0.4ML ~~LOC~~ SOLN
40.0000 mg | SUBCUTANEOUS | Status: DC
  Administered 2011-11-21 – 2011-11-27 (×7): 40 mg via SUBCUTANEOUS
  Filled 2011-11-21 (×7): qty 0.4

## 2011-11-21 MED ORDER — ENOXAPARIN SODIUM 40 MG/0.4ML ~~LOC~~ SOLN
40.0000 mg | SUBCUTANEOUS | Status: DC
  Filled 2011-11-21: qty 0.4

## 2011-11-21 MED ORDER — AMLODIPINE BESYLATE 5 MG PO TABS
5.0000 mg | ORAL_TABLET | Freq: Every day | ORAL | Status: DC
  Administered 2011-11-21 – 2011-11-27 (×7): 5 mg via ORAL
  Filled 2011-11-21 (×7): qty 1

## 2011-11-21 MED ORDER — LATANOPROST 0.005 % OP SOLN
1.0000 [drp] | Freq: Every day | OPHTHALMIC | Status: DC
  Administered 2011-11-21 – 2011-11-26 (×6): 1 [drp] via OPHTHALMIC
  Filled 2011-11-21: qty 2.5

## 2011-11-21 MED ORDER — ZOLPIDEM TARTRATE 5 MG PO TABS: 5.0000 mg | ORAL_TABLET | Freq: Every evening | ORAL | Status: DC | PRN

## 2011-11-21 MED ORDER — ENSURE PO LIQD
237.0000 mL | Freq: Two times a day (BID) | ORAL | Status: DC
Start: 2011-11-21 — End: 2011-11-27
  Administered 2011-11-21: 14:00:00 via ORAL
  Administered 2011-11-22 – 2011-11-27 (×11): 237 mL via ORAL
  Filled 2011-11-21 (×16): qty 237

## 2011-11-21 MED ORDER — BIMATOPROST 0.03 % OP SOLN
1.0000 [drp] | Freq: Every day | OPHTHALMIC | Status: DC
  Filled 2011-11-21: qty 2.5

## 2011-11-21 MED ORDER — POTASSIUM CHLORIDE CRYS ER 20 MEQ PO TBCR
20.0000 meq | EXTENDED_RELEASE_TABLET | Freq: Every day | ORAL | Status: DC
  Administered 2011-11-21 – 2011-11-22 (×2): 20 meq via ORAL
  Filled 2011-11-21 (×3): qty 1

## 2011-11-21 MED ORDER — BIMATOPROST 0.03 % OP SOLN
1.0000 [drp] | Freq: Every day | OPHTHALMIC | Status: DC
  Administered 2011-11-21 – 2011-11-26 (×6): 1 [drp] via OPHTHALMIC
  Filled 2011-11-21: qty 2.5

## 2011-11-21 MED ORDER — ACETAMINOPHEN 325 MG PO TABS
650.0000 mg | ORAL_TABLET | Freq: Two times a day (BID) | ORAL | Status: DC
  Administered 2011-11-21 – 2011-11-27 (×13): 650 mg via ORAL
  Filled 2011-11-21 (×12): qty 2
  Filled 2011-11-21: qty 1
  Filled 2011-11-21: qty 2

## 2011-11-21 MED ORDER — POTASSIUM CHLORIDE IN NACL 20-0.9 MEQ/L-% IV SOLN
INTRAVENOUS | Status: DC
  Administered 2011-11-21 – 2011-11-25 (×3): via INTRAVENOUS
  Filled 2011-11-21 (×6): qty 1000

## 2011-11-21 MED ORDER — ASPIRIN EC 325 MG PO TBEC: 325.0000 mg | DELAYED_RELEASE_TABLET | Freq: Every day | ORAL | Status: DC

## 2011-11-21 MED ORDER — VITAMIN D3 25 MCG (1000 UNIT) PO TABS
1000.0000 [IU] | ORAL_TABLET | Freq: Every day | ORAL | Status: DC
  Administered 2011-11-21 – 2011-11-27 (×7): 1000 [IU] via ORAL
  Filled 2011-11-21 (×7): qty 1

## 2011-11-21 MED ORDER — CARVEDILOL 12.5 MG PO TABS
12.5000 mg | ORAL_TABLET | Freq: Two times a day (BID) | ORAL | Status: DC
  Administered 2011-11-21 – 2011-11-27 (×12): 12.5 mg via ORAL
  Filled 2011-11-21 (×15): qty 1

## 2011-11-21 MED ORDER — SENNOSIDES-DOCUSATE SODIUM 8.6-50 MG PO TABS
1.0000 | ORAL_TABLET | Freq: Every evening | ORAL | Status: DC | PRN
  Filled 2011-11-21: qty 1

## 2011-11-21 MED ORDER — LEVOFLOXACIN IN D5W 250 MG/50ML IV SOLN
250.0000 mg | INTRAVENOUS | Status: DC
  Administered 2011-11-21 – 2011-11-22 (×2): 250 mg via INTRAVENOUS
  Filled 2011-11-21 (×3): qty 50

## 2011-11-21 MED ORDER — SIMVASTATIN 20 MG PO TABS
20.0000 mg | ORAL_TABLET | Freq: Every day | ORAL | Status: DC
  Administered 2011-11-21 – 2011-11-26 (×6): 20 mg via ORAL
  Filled 2011-11-21 (×7): qty 1

## 2011-11-21 NOTE — Progress Notes (Signed)
Subjective: Today she is more alert. She continues to have a minimally productive cough without shortness of breath. She is eating more today. She denies abdominal pain, diarrhea, constipation, or dysuria.  Objective: Vital signs in last 24 hours: Temp:  [97.2 F (36.2 C)-98.6 F (37 C)] 97.2 F (36.2 C) (12/22 1450) Pulse Rate:  [100-122] 103  (12/22 1450) Resp:  [19-33] 19  (12/22 1450) BP: (103-160)/(58-110) 103/69 mmHg (12/22 1450) SpO2:  [92 %-100 %] 100 % (12/22 1450) FiO2 (%):  [2 %] 2 % (12/21 2300) Weight:  [59.2 kg (130 lb 8.2 oz)] 130 lb 8.2 oz (59.2 kg) (12/22 0251) Weight change:    Intake/Output from previous day: 12/21 0701 - 12/22 0700 In: 86.7 [I.V.:36.7; IV Piggyback:50] Out: -    General appearance: alert, cooperative and no distress Resp: clear to auscultation bilaterally Cardio: irregularly irregular rhythm Extremities: extremities normal, atraumatic, no cyanosis or edema Neurologic: Grossly normal  Lab Results:  Basename 11/21/11 0700 11/21/11 0120  WBC 13.5* 13.1*  HGB 11.5* 11.8*  HCT 36.2 36.8  PLT 196 187   BMET  Basename 11/21/11 0700 11/21/11 0120 11/20/11 2114  NA 138 -- 136  K 3.5 -- 3.7  CL 100 -- 99  CO2 25 -- 25  GLUCOSE 106* -- 136*  BUN 18 -- 16  CREATININE 0.81 0.78 --  CALCIUM 9.3 -- 9.4   CMET CMP     Component Value Date/Time   NA 138 11/21/2011 0700   K 3.5 11/21/2011 0700   CL 100 11/21/2011 0700   CO2 25 11/21/2011 0700   GLUCOSE 106* 11/21/2011 0700   BUN 18 11/21/2011 0700   CREATININE 0.81 11/21/2011 0700   CALCIUM 9.3 11/21/2011 0700   PROT 7.0 11/21/2011 0700   ALBUMIN 2.4* 11/21/2011 0700   AST 9 11/21/2011 0700   ALT 8 11/21/2011 0700   ALKPHOS 73 11/21/2011 0700   BILITOT 0.5 11/21/2011 0700   GFRNONAA 61* 11/21/2011 0700   GFRAA 70* 11/21/2011 0700    CBG (last 3)  No results found for this basename: GLUCAP:3 in the last 72 hours  INR RESULTS:   Lab Results  Component Value Date   INR 0.9  07/13/2009     Studies/Results: Dg Chest 2 View  11/20/2011  *RADIOLOGY REPORT*  Clinical Data: Chest pain.  CHEST - 2 VIEW  Comparison: 09/23/2010  Findings: Cardiomegaly is stable.  Bilateral lower lobe airspace disease is new since previous study, may be due to edema or pneumonia. No significant pleural effusion identified.  IMPRESSION:  1.  Bilateral lower lobe airspace disease, which may be due to pulmonary edema or multilobar pneumonia. 2.  Stable cardiomegaly.  Original Report Authenticated By: Danae Orleans, M.D.   Dg Hip Complete Left  11/20/2011  *RADIOLOGY REPORT*  Clinical Data: Fall.  Left hip injury and pain.  LEFT HIP - COMPLETE 2+ VIEW  Comparison: None.  Findings: No evidence of left hip fracture or dislocation.  No acetabular or pelvic fracture identified.  Mild left hip DJD is seen with subchondral geode in the femoral head.  Generalized osteopenia is noted as well as peripheral vascular calcification.  IMPRESSION:  1.  No acute findings. 2.  Mild left hip DJD.  Original Report Authenticated By: Danae Orleans, M.D.    Medications: I have reviewed the patient's current medications.  Assessment/Plan: #1 Bibasilar Pneumonia: Stable on current medications without improvement in white blood cell count suggesting improvement in pneumonia. #2 Atrial Fibrillation: Stable on current  medications. #3 Hypoalbuminemia: Likely due to chronic protein calorie malnutrition, and could also be from an acute phase reaction. We will add Ensure one can 3 times daily.  LOS: 1 day   Ashia Dehner G 11/21/2011, 7:31 PM

## 2011-11-21 NOTE — H&P (Signed)
[name] is an 77 y.o. female.   Chief Complaint: weak with cough HPI:  Patient is a 77 year old woman who is a resident [facility] assisted living facility and was sent to the emergency room because she was somewhat lethargic and had slipped to the floor at the facility. She did not have apparent physical injury. She has been having some cough lately without fever or chills or shortness of breath. The workup in the emergency room was most notable for a mildly elevated white blood cell count of 16,000 without fever, and a chest x-ray that showed bibasilar airspace disease consistent with pneumonia. She has been started on broad-spectrum antibiotics by emergency room personnel. History was provided by the patient and her son who was in attendance. Her medical history is significant for hypertension, glaucoma, chronic atrial fibrillation, pulmonary embolism with IVC filter placement, subdural hematoma requiring evacuation, coronary artery disease, and decreased hearing. She also has osteoarthritis in the spine and left hip and generally ambulates with a walker Past Medical History  Diagnosis Date  . Arthritis   . Angina   . Coronary artery disease   . Hypertension   . Myocardial infarction   . Dysrhythmia   . DVT (deep venous thrombosis)   . Subdural hematoma   . Glaucoma   . Hearing loss   . Atrial fibrillation     Medications Prior to Admission  Medication Dose Route Frequency Provider Last Rate Last Dose  . 0.9 %  sodium chloride infusion   Intravenous Once Nat Christen, MD 125 mL/hr at 11/20/11 2259    . ceFEPIme (MAXIPIME) 2 g in dextrose 5 % 50 mL IVPB  2 g Intravenous Q12H Nat Christen, MD   2 g at 11/20/11 2258  . Levofloxacin (LEVAQUIN) IVPB 750 mg  750 mg Intravenous Q24H Nat Christen, MD   750 mg at 11/21/11 0047  . sodium chloride 0.9 % bolus 1,000 mL  1,000 mL Intravenous Once Nat Christen, MD   1,000 mL at 11/20/11 2258  . vancomycin (VANCOCIN) IVPB  1000 mg/200 mL premix  1,000 mg Intravenous Once Nat Christen, MD   1,000 mg at 11/20/11 2336  . DISCONTD: ceFEPIme (MAXIPIME) 2 g in dextrose 5 % 50 mL IVPB  2 g Intravenous Once Nat Christen, MD      . DISCONTD: Levofloxacin (LEVAQUIN) IVPB 750 mg  750 mg Intravenous Once Nat Christen, MD       No current outpatient prescriptions on file as of 11/20/2011.    ADDITIONAL HOME MEDICATIONS: Home medication list not available at this time  PHYSICIANS INVOLVED IN CARE: Buren Kos (primary care)  Past Surgical History  Procedure Date  . Subdural hematoma evacuation via craniotomy     History reviewed. No pertinent family history.   Social History:  reports that she has never smoked. She does not have any smokeless tobacco history on file. She reports that she does not drink alcohol or use illicit drugs.  Allergies: No Known Allergies   ROS: ankle swelling, arthritis and heart palpitation  PHYSICAL EXAM: Blood pressure 132/65, pulse 113, temperature 98.6 F (37 C), temperature source Oral, resp. rate 20, SpO2 93.00%. In general she is an elderly woman who had frequent productive cough while lying at 10 elevation head of bed. She did not have shortness breath. HEENT exam was within normal limits, neck was supple without jugular venous distention or carotid bruit, chest had bibasilar crackles, heart had an irregularly  irregular rhythm without significant murmur or gallop, abdomen had normal bowel sounds and no hepatosplenomegaly or tenderness, extremities were without cyanosis, clubbing, or edema and the pedal pulses were normal. Neurologic exam: She was alert and able to answer questions when spoken to a loud voice, and she did move all extremities somewhat.  Results for orders placed during the hospital encounter of 11/20/11 (from the past 48 hour(s))  CBC     Status: Abnormal   Collection Time   11/20/11  9:14 PM      Component Value Range Comment   WBC 16.1 (*) 4.0  - 10.5 (K/uL)    RBC 4.24  3.87 - 5.11 (MIL/uL)    Hemoglobin 12.3  12.0 - 15.0 (g/dL)    HCT 40.9  81.1 - 91.4 (%)    MCV 87.0  78.0 - 100.0 (fL)    MCH 29.0  26.0 - 34.0 (pg)    MCHC 33.3  30.0 - 36.0 (g/dL)    RDW 78.2 (*) 95.6 - 15.5 (%)    Platelets 194  150 - 400 (K/uL)   BASIC METABOLIC PANEL     Status: Abnormal   Collection Time   11/20/11  9:14 PM      Component Value Range Comment   Sodium 136  135 - 145 (mEq/L)    Potassium 3.7  3.5 - 5.1 (mEq/L)    Chloride 99  96 - 112 (mEq/L)    CO2 25  19 - 32 (mEq/L)    Glucose, Bld 136 (*) 70 - 99 (mg/dL)    BUN 16  6 - 23 (mg/dL)    Creatinine, Ser 2.13  0.50 - 1.10 (mg/dL)    Calcium 9.4  8.4 - 10.5 (mg/dL)    GFR calc non Af Amer 70 (*) >90 (mL/min)    GFR calc Af Amer 81 (*) >90 (mL/min)   CARDIAC PANEL(CRET KIN+CKTOT+MB+TROPI)     Status: Normal   Collection Time   11/20/11  9:14 PM      Component Value Range Comment   Total CK 68  7 - 177 (U/L)    CK, MB 1.3  0.3 - 4.0 (ng/mL)    Troponin I <0.30  <0.30 (ng/mL)    Relative Index RELATIVE INDEX IS INVALID  0.0 - 2.5    URINALYSIS, ROUTINE W REFLEX MICROSCOPIC     Status: Abnormal   Collection Time   11/20/11  9:35 PM      Component Value Range Comment   Color, Urine AMBER (*) YELLOW  BIOCHEMICALS MAY BE AFFECTED BY COLOR   APPearance CLEAR  CLEAR     Specific Gravity, Urine 1.026  1.005 - 1.030     pH 5.5  5.0 - 8.0     Glucose, UA NEGATIVE  NEGATIVE (mg/dL)    Hgb urine dipstick LARGE (*) NEGATIVE     Bilirubin Urine SMALL (*) NEGATIVE     Ketones, ur 40 (*) NEGATIVE (mg/dL)    Protein, ur >086 (*) NEGATIVE (mg/dL)    Urobilinogen, UA 1.0  0.0 - 1.0 (mg/dL)    Nitrite NEGATIVE  NEGATIVE     Leukocytes, UA NEGATIVE  NEGATIVE    URINE MICROSCOPIC-ADD ON     Status: Normal   Collection Time   11/20/11  9:35 PM      Component Value Range Comment   WBC, UA 0-2  <3 (WBC/hpf)    RBC / HPF 11-20  <3 (RBC/hpf)    Bacteria, UA RARE  RARE  Urine-Other MUCOUS  PRESENT     LACTIC ACID, PLASMA     Status: Normal   Collection Time   11/20/11 10:44 PM      Component Value Range Comment   Lactic Acid, Venous 1.1  0.5 - 2.2 (mmol/L)   PROCALCITONIN     Status: Normal   Collection Time   11/20/11 10:44 PM      Component Value Range Comment   Procalcitonin 0.28      Dg Chest 2 View  11/20/2011  *RADIOLOGY REPORT*  Clinical Data: Chest pain.  CHEST - 2 VIEW  Comparison: 09/23/2010  Findings: Cardiomegaly is stable.  Bilateral lower lobe airspace disease is new since previous study, may be due to edema or pneumonia. No significant pleural effusion identified.  IMPRESSION:  1.  Bilateral lower lobe airspace disease, which may be due to pulmonary edema or multilobar pneumonia. 2.  Stable cardiomegaly.  Original Report Authenticated By: Danae Orleans, M.D.   Dg Hip Complete Left  11/20/2011  *RADIOLOGY REPORT*  Clinical Data: Fall.  Left hip injury and pain.  LEFT HIP - COMPLETE 2+ VIEW  Comparison: None.  Findings: No evidence of left hip fracture or dislocation.  No acetabular or pelvic fracture identified.  Mild left hip DJD is seen with subchondral geode in the femoral head.  Generalized osteopenia is noted as well as peripheral vascular calcification.  IMPRESSION:  1.  No acute findings. 2.  Mild left hip DJD.  Original Report Authenticated By: Danae Orleans, M.D.   Note that she has had a DO NOT RESUSCITATE order at the assisted-living facility.  Assessment/Plan #1 Pneumonia: She has bibasilar airspace disease on chest x-ray and leukocytosis consistent with pneumonia. Treatment with cefepime and Levaquin should be adequate for this and has been initiated. In addition she'll have this cannula oxygen and some IV fluids. When her condition is improved we will transfer her back to her assisted living facility. #2 Atrial Fibrillation: A chronic issue for her and she has a relative contraindication for Coumadin given her history of subdural hematoma. She is  clinically stable. #3 Left Hip Pain: Likely from osteoarthritis, and a significant issue for her. While she is inpatient we will try to increase her pain medications and have physical therapist evaluate her. She may be a candidate for cortisone injection if standard by mouth medications are not very effective. #4 Coronary Artery Disease: Stable on current medications.   Shatoria Stooksbury G 11/21/2011, 12:50 AM

## 2011-11-22 LAB — CBC
HCT: 33.6 % — ABNORMAL LOW (ref 36.0–46.0)
Hemoglobin: 10.7 g/dL — ABNORMAL LOW (ref 12.0–15.0)
MCV: 88.4 fL (ref 78.0–100.0)
Platelets: 192 10*3/uL (ref 150–400)
RBC: 3.8 MIL/uL — ABNORMAL LOW (ref 3.87–5.11)
WBC: 10.1 10*3/uL (ref 4.0–10.5)

## 2011-11-22 MED ORDER — POTASSIUM CHLORIDE CRYS ER 20 MEQ PO TBCR: 20.0000 meq | EXTENDED_RELEASE_TABLET | Freq: Every day | ORAL | Status: DC

## 2011-11-22 MED ORDER — POTASSIUM CHLORIDE 20 MEQ/15ML (10%) PO LIQD
20.0000 meq | Freq: Every day | ORAL | Status: DC
  Administered 2011-11-23 – 2011-11-24 (×2): 20 meq via ORAL
  Filled 2011-11-22 (×3): qty 15

## 2011-11-22 MED ORDER — POTASSIUM CHLORIDE 20 MEQ PO PACK
20.0000 meq | PACK | Freq: Every day | ORAL | Status: DC
  Filled 2011-11-22: qty 1

## 2011-11-22 NOTE — Progress Notes (Signed)
Subjective:s According to nursing staff she is more alert today, and is eating well. She is currently sleeping.  Objective: Vital signs in last 24 hours: Temp:  [97.9 F (36.6 C)-98.1 F (36.7 C)] 97.9 F (36.6 C) (12/23 1402) Pulse Rate:  [96-111] 111  (12/23 1402) Resp:  [18-22] 18  (12/23 1402) BP: (111-136)/(63-78) 111/65 mmHg (12/23 1402) SpO2:  [90 %-96 %] 96 % (12/23 1402) Weight:  [60 kg (132 lb 4.4 oz)] 132 lb 4.4 oz (60 kg) (12/23 0457) Weight change: 0.8 kg (1 lb 12.2 oz)   Intake/Output from previous day: 12/22 0701 - 12/23 0700 In: 1319.2 [P.O.:480; I.V.:689.2; IV Piggyback:150] Out: 801 [Urine:800; Stool:1]   General appearance: no distress Resp: bibasilar crackles Cardio: irregularly irregular rhythm Extremities: extremities normal, atraumatic, no cyanosis or edema  Lab Results:  Basename 11/22/11 0625 11/21/11 0700  WBC 10.1 13.5*  HGB 10.7* 11.5*  HCT 33.6* 36.2  PLT 192 196   BMET  Basename 11/21/11 0700 11/21/11 0120 11/20/11 2114  NA 138 -- 136  K 3.5 -- 3.7  CL 100 -- 99  CO2 25 -- 25  GLUCOSE 106* -- 136*  BUN 18 -- 16  CREATININE 0.81 0.78 --  CALCIUM 9.3 -- 9.4   CMET CMP     Component Value Date/Time   NA 138 11/21/2011 0700   K 3.5 11/21/2011 0700   CL 100 11/21/2011 0700   CO2 25 11/21/2011 0700   GLUCOSE 106* 11/21/2011 0700   BUN 18 11/21/2011 0700   CREATININE 0.81 11/21/2011 0700   CALCIUM 9.3 11/21/2011 0700   PROT 7.0 11/21/2011 0700   ALBUMIN 2.4* 11/21/2011 0700   AST 9 11/21/2011 0700   ALT 8 11/21/2011 0700   ALKPHOS 73 11/21/2011 0700   BILITOT 0.5 11/21/2011 0700   GFRNONAA 61* 11/21/2011 0700   GFRAA 70* 11/21/2011 0700    CBG (last 3)  No results found for this basename: GLUCAP:3 in the last 72 hours  INR RESULTS:   Lab Results  Component Value Date   INR 0.9 07/13/2009     Studies/Results: Dg Chest 2 View  11/20/2011  *RADIOLOGY REPORT*  Clinical Data: Chest pain.  CHEST - 2 VIEW  Comparison:  09/23/2010  Findings: Cardiomegaly is stable.  Bilateral lower lobe airspace disease is new since previous study, may be due to edema or pneumonia. No significant pleural effusion identified.  IMPRESSION:  1.  Bilateral lower lobe airspace disease, which may be due to pulmonary edema or multilobar pneumonia. 2.  Stable cardiomegaly.  Original Report Authenticated By: Danae Orleans, M.D.   Dg Hip Complete Left  11/20/2011  *RADIOLOGY REPORT*  Clinical Data: Fall.  Left hip injury and pain.  LEFT HIP - COMPLETE 2+ VIEW  Comparison: None.  Findings: No evidence of left hip fracture or dislocation.  No acetabular or pelvic fracture identified.  Mild left hip DJD is seen with subchondral geode in the femoral head.  Generalized osteopenia is noted as well as peripheral vascular calcification.  IMPRESSION:  1.  No acute findings. 2.  Mild left hip DJD.  Original Report Authenticated By: Danae Orleans, M.D.    Medications: I have reviewed the patient's current medications.  Assessment/Plan: #1 Pneumonia:  Slowly improving on current meds. Vancomycin not continued and WBC count continues to improve. She is an ALF resident, but is not quite ready for discharge yet. #2 Atrial Fibrillation: stable #3 Protein Calorie Malnutrition:  Improving and taking supplements well.  LOS: 2  days   Jeslin Bazinet G 11/22/2011, 3:25 PM

## 2011-11-23 ENCOUNTER — Inpatient Hospital Stay (HOSPITAL_COMMUNITY): Payer: Medicare HMO

## 2011-11-23 LAB — CBC
HCT: 37.4 % (ref 36.0–46.0)
Hemoglobin: 12 g/dL (ref 12.0–15.0)
MCHC: 32.1 g/dL (ref 30.0–36.0)
MCV: 89.3 fL (ref 78.0–100.0)

## 2011-11-23 MED ORDER — LEVOFLOXACIN 250 MG PO TABS
250.0000 mg | ORAL_TABLET | Freq: Every day | ORAL | Status: AC
  Administered 2011-11-23 – 2011-11-27 (×5): 250 mg via ORAL
  Filled 2011-11-23 (×6): qty 1

## 2011-11-23 NOTE — Plan of Care (Signed)
Problem: Phase I Progression Outcomes Goal: Code status addressed with pt/family Outcome: Completed/Met Date Met:  11/23/11 Pt is a DNR

## 2011-11-23 NOTE — Progress Notes (Signed)
Occupational Therapy Evaluation Patient Details Name: [name] MRN: [id number] DOB: [birth year] Today's Date: 11/23/2011  Problem List:  Patient Active Problem List  Diagnoses  . Pneumonia  . Hypertension  . Glaucoma  . Atrial fibrillation  . Pulmonary embolism  . Subdural hematoma  . Coronary artery disease  . Hearing loss  . Hip pain, left  . Gait instability    Past Medical History:  Past Medical History  Diagnosis Date  . Arthritis   . Angina   . Coronary artery disease   . Hypertension   . Myocardial infarction   . Dysrhythmia   . DVT (deep venous thrombosis)   . Subdural hematoma   . Glaucoma   . Hearing loss   . Atrial fibrillation    Past Surgical History:  Past Surgical History  Procedure Date  . Subdural hematoma evacuation via craniotomy     OT Assessment/Plan/Recommendation OT Assessment Clinical Impression Statement: Pt is a 77 year old woman with multiple comorbidities who is now admitted with B PNA.  Pt required assist for IADL, LB ADL, and showering PTA at the ALF.  Will follow pt acutely.  Recommend return to ALF with HHOT until pt returns to baseline.  No equipment needs identified. OT Recommendation/Assessment: Patient will need skilled OT in the acute care venue OT Problem List: Decreased activity tolerance;Impaired balance (sitting and/or standing);Decreased knowledge of use of DME or AE OT Therapy Diagnosis : Generalized weakness OT Plan OT Frequency: Min 2X/week OT Treatment/Interventions: Self-care/ADL training;DME and/or AE instruction;Patient/family education OT Recommendation Follow Up Recommendations: Home health OT;Other (comment) (at ALF) Equipment Recommended: None recommended by OT Individuals Consulted Consulted and Agree with Results and Recommendations: Patient OT Goals Acute Rehab OT Goals OT Goal Formulation: With patient Time For Goal Achievement: 2 weeks ADL Goals Pt Will Perform Grooming: with supervision;Standing  at sink ADL Goal: Grooming - Progress: Not met Pt Will Perform Lower Body Bathing: with supervision;with adaptive equipment;Sitting, edge of bed;Sit to stand from bed;Other (comment) (trial use of long sponge) ADL Goal: Lower Body Bathing - Progress: Not met Pt Will Perform Lower Body Dressing: with adaptive equipment;Sit to stand from bed;Sitting, bed;with supervision;Other (comment) (trial use of LB AE) ADL Goal: Lower Body Dressing - Progress: Not met Pt Will Transfer to Toilet: with supervision;3-in-1;Other (comment);Ambulation (over toilet) ADL Goal: Toilet Transfer - Progress: Not met  OT Evaluation Precautions/Restrictions  Precautions Precautions: Fall Required Braces or Orthoses: No Restrictions Weight Bearing Restrictions: No Prior Functioning Home Living Lives With: Other (Comment) (ALF) Receives Help From: Other (Comment);Family (ALF staff) Type of Home: Assisted living Home Layout: One level Home Access: Level entry Bathroom Shower/Tub: Walk-in shower;Curtain Bathroom Toilet: Handicapped height Bathroom Accessibility: Yes How Accessible: Accessible via walker Home Adaptive Equipment: Walker - rolling Prior Function Level of Independence: Requires assistive device for independence;Independent with transfers;Needs assistance with homemaking;Independent with basic ADLs (assisted for donning compression hose and for shower transfe) Able to Take Stairs?: No Driving: No ADL ADL Eating/Feeding: Performed;Independent Where Assessed - Eating/Feeding: Chair Grooming: Performed;Set up;Wash/dry face;Teeth care Where Assessed - Grooming: Sitting, chair Upper Body Bathing: Simulated;Set up Where Assessed - Upper Body Bathing: Sitting, chair Lower Body Bathing: Simulated;Minimal assistance Lower Body Bathing Details (indicate cue type and reason): difficulty reaching feet at baseline Where Assessed - Lower Body Bathing: Sitting, chair;Sit to stand from chair Upper Body  Dressing: Performed;Set up Where Assessed - Upper Body Dressing: Sitting, chair Lower Body Dressing: Minimal assistance;Performed Lower Body Dressing Details (indicate cue type and reason):  pt required assist for compression hose, wears slip on shoes Where Assessed - Lower Body Dressing: Sitting, chair;Sit to stand from chair Toilet Transfer: Simulated;Minimal assistance Toilet Transfer Details (indicate cue type and reason): bed to and from chair Toilet Transfer Method: Ambulating Toileting - Hygiene: Other (comment) (pt reports independence) Equipment Used: Rolling walker ADL Comments: Pt wishes to explore AE for LB ADL in therapy. Vision/Perception  Vision - History Baseline Vision: Wears glasses all the time Patient Visual Report: No change from baseline Cognition Cognition Arousal/Alertness: Awake/alert Overall Cognitive Status: Appears within functional limits for tasks assessed Orientation Level: Oriented X4 Sensation/Coordination Sensation Light Touch: Appears Intact Stereognosis: Not tested Hot/Cold: Appears Intact Proprioception: Appears Intact Coordination Gross Motor Movements are Fluid and Coordinated: Yes Fine Motor Movements are Fluid and Coordinated: Yes Extremity Assessment RUE Assessment RUE Assessment: Within Functional Limits LUE Assessment LUE Assessment: Within Functional Limits (OA changes B hands) Mobility  Bed Mobility Bed Mobility: No Transfers Sit to Stand: 4: Min assist;With upper extremity assist;From chair/3-in-1 Sit to Stand Details (indicate cue type and reason): Assist for balance with cues for safest hand placement. Stand to Sit: 4: Min assist;To chair/3-in-1;With upper extremity assist;Other (comment) (decreased control of descent) Stand to Sit Details: Guarding for balance only cues for hand placement. End of Session OT - End of Session Equipment Utilized During Treatment: Gait belt Activity Tolerance: Patient tolerated treatment  well Patient left: in chair;with call bell in reach;with family/visitor present General Behavior During Session: [facility] for tasks performed Cognition: [facility] for tasks performed   Evern Bio 11/23/2011, 1:39 PM  [phone]

## 2011-11-23 NOTE — Progress Notes (Signed)
Physical Therapy Evaluation Patient Details Name: [name] MRN: [id number] DOB: 1916-09-01 Today's Date: 11/23/2011  Problem List:  Patient Active Problem List  Diagnoses  . Pneumonia  . Hypertension  . Glaucoma  . Atrial fibrillation  . Pulmonary embolism  . Subdural hematoma  . Coronary artery disease  . Hearing loss  . Hip pain, left  . Gait instability    Past Medical History:  Past Medical History  Diagnosis Date  . Arthritis   . Angina   . Coronary artery disease   . Hypertension   . Myocardial infarction   . Dysrhythmia   . DVT (deep venous thrombosis)   . Subdural hematoma   . Glaucoma   . Hearing loss   . Atrial fibrillation    Past Surgical History:  Past Surgical History  Procedure Date  . Subdural hematoma evacuation via craniotomy     PT Assessment/Plan/Recommendation PT Assessment Clinical Impression Statement: Pt is a 77 y/o female admitted secondary to PNA with a fall along with the below PT problem list.  Pt would benefit from acute PT to maximize independence and facilitate d/c to ALF with HHPT. PT Recommendation/Assessment: Patient will need skilled PT in the acute care venue PT Problem List: Decreased activity tolerance;Decreased balance;Decreased mobility;Pain Barriers to Discharge: None PT Therapy Diagnosis : Difficulty walking;Acute pain PT Plan PT Frequency: Min 3X/week PT Treatment/Interventions: DME instruction;Gait training;Functional mobility training;Therapeutic activities;Balance training;Patient/family education PT Recommendation Follow Up Recommendations: Home health PT (At ALF.) Equipment Recommended: None recommended by PT PT Goals  Acute Rehab PT Goals PT Goal Formulation: With patient/family Time For Goal Achievement: 7 days Pt will go Supine/Side to Sit: with modified independence PT Goal: Supine/Side to Sit - Progress: Not met Pt will go Sit to Supine/Side: with modified independence PT Goal: Sit to Supine/Side -  Progress: Not met Pt will go Sit to Stand: with modified independence PT Goal: Sit to Stand - Progress: Not met Pt will go Stand to Sit: with modified independence PT Goal: Stand to Sit - Progress: Not met Pt will Ambulate: >150 feet;with modified independence;with least restrictive assistive device PT Goal: Ambulate - Progress: Not met  PT Evaluation Precautions/Restrictions  Precautions Precautions: Fall Required Braces or Orthoses: No Restrictions Weight Bearing Restrictions: No Prior Functioning  Home Living Lives With: Alone (At ALF.) Receives Help From: Other (Comment) (Staff at ALF.) Type of Home: Assisted living Home Layout: One level Home Access: Level entry Home Adaptive Equipment: Walker - rolling Prior Function Level of Independence: Requires assistive device for independence;Independent with transfers;Needs assistance with homemaking;Independent with basic ADLs Able to Take Stairs?: No Driving: No Cognition Cognition Arousal/Alertness: Awake/alert Overall Cognitive Status: Appears within functional limits for tasks assessed Orientation Level: Oriented X4 Sensation/Coordination Sensation Light Touch: Appears Intact Stereognosis: Not tested Hot/Cold: Not tested Proprioception: Not tested Coordination Gross Motor Movements are Fluid and Coordinated: Yes Fine Motor Movements are Fluid and Coordinated: Not tested Extremity Assessment RUE Assessment RUE Assessment: Within Functional Limits LUE Assessment LUE Assessment: Within Functional Limits RLE Assessment RLE Assessment: Within Functional Limits LLE Assessment LLE Assessment: Exceptions to [facility] LLE Strength LLE Overall Strength: Within Functional Limits for tasks assessed;Due to pain Pain 0/10 with evaluation. Mobility (including Balance) Bed Mobility Bed Mobility: No Transfers Transfers: Yes Sit to Stand: 4: Min assist;With upper extremity assist;From chair/3-in-1 Sit to Stand Details (indicate  cue type and reason): Assist for balance with cues for safest hand placement. Stand to Sit: 4: Min assist;To chair/3-in-1;With upper extremity assist (Min (guard))  Stand to Sit Details: Guarding for balance only cues for hand placement. Ambulation/Gait Ambulation/Gait: Yes Ambulation/Gait Assistance: 4: Min assist (Progressed to min (guard)) Ambulation/Gait Assistance Details (indicate cue type and reason): Assist initially to advance left LE secondary to pain with pt able to progress to min (guard). Ambulation Distance (Feet): 40 Feet Assistive device: Rolling walker Gait Pattern: Decreased step length - left;Decreased stance time - left;Trunk flexed Stairs: No Wheelchair Mobility Wheelchair Mobility: No  Posture/Postural Control Posture/Postural Control: No significant limitations Balance Balance Assessed: No End of Session PT - End of Session Equipment Utilized During Treatment: Gait belt Activity Tolerance: Patient tolerated treatment well Patient left: in chair;with call bell in reach;with family/visitor present Nurse Communication: Mobility status for transfers;Mobility status for ambulation General Behavior During Session: [facility] for tasks performed Cognition: [facility] for tasks performed  Cephus Shelling 11/23/2011, 12:55 PM  11/23/2011 Cephus Shelling, PT, DPT [phone]

## 2011-11-23 NOTE — Progress Notes (Signed)
Pt had 6 beats of V-tach, pt was asymptomatic, v/s were stable t-98.4, P-94, R-20, BP-140/90, o2-92 on 2l. Dr. Jacky Kindle was notified who said to keep an eye on pt. ----[name], D. rn

## 2011-11-23 NOTE — Progress Notes (Signed)
Subjective:s Sitting in chair. No C/O No SOB No Cough. Very HOH. Interacting fine. Appears weak.  Pt had 6 beats of Asxatic V-tach overnight with AVSS   Objective: Vital signs in last 24 hours: Temp:  [97.9 F (36.6 C)-98.5 F (36.9 C)] 98.5 F (36.9 C) (12/24 0403) Pulse Rate:  [93-111] 93  (12/24 0403) Resp:  [18-20] 20  (12/24 0403) BP: (111-143)/(60-90) 143/90 mmHg (12/24 0403) SpO2:  [92 %-96 %] 92 % (12/24 0403) Weight:  [132 lb 15 oz (60.3 kg)] 132 lb 15 oz (60.3 kg) (12/24 0403) Weight change: 10.6 oz (0.3 kg)   Intake/Output from previous day: 12/23 0701 - 12/24 0700 In: 1230.3 [P.O.:840; I.V.:340.3; IV Piggyback:50] Out: 629 [Urine:625; Stool:4]   PE) Tired. HOH General appearance: no distress Resp: bibasilar crackles Cardio: irregularly irregular rhythm Extremities: extremities normal, atraumatic, no cyanosis or edema  Lab Results:  Basename 11/23/11 0555 11/22/11 0625  WBC 7.8 10.1  HGB 12.0 10.7*  HCT 37.4 33.6*  PLT 207 192   BMET  Basename 11/21/11 0700 11/21/11 0120 11/20/11 2114  NA 138 -- 136  K 3.5 -- 3.7  CL 100 -- 99  CO2 25 -- 25  GLUCOSE 106* -- 136*  BUN 18 -- 16  CREATININE 0.81 0.78 --  CALCIUM 9.3 -- 9.4   CMET CMP     Component Value Date/Time   NA 138 11/21/2011 0700   K 3.5 11/21/2011 0700   CL 100 11/21/2011 0700   CO2 25 11/21/2011 0700   GLUCOSE 106* 11/21/2011 0700   BUN 18 11/21/2011 0700   CREATININE 0.81 11/21/2011 0700   CALCIUM 9.3 11/21/2011 0700   PROT 7.0 11/21/2011 0700   ALBUMIN 2.4* 11/21/2011 0700   AST 9 11/21/2011 0700   ALT 8 11/21/2011 0700   ALKPHOS 73 11/21/2011 0700   BILITOT 0.5 11/21/2011 0700   GFRNONAA 61* 11/21/2011 0700   GFRAA 70* 11/21/2011 0700    Medications: I have reviewed the patient's current medications.  Assessment/Plan: Patient is a 77 year old woman who is a resident [facility] assisted living facility resident sent to the ED with lethargy and Global  Weakness.  In the ED she had Leukocytosis and B PNA.   Placed on Abx and admitted.   History was provided by the patient and her son who was in attendance.    #1 Bibasilar Pneumonia:  Slowly improving on current meds. Vancomycin not continued and WBC count continues to improve. She is an ALF resident, but is not quite ready for discharge yet.  She is currently on Cefipime and Levaquin.  Will convert the Levaquin to PO.  D/c the Cefipime.  The WBC has improved.  Follow up On CXR. #2 Chronic Atrial Fibrillation: stable #3 Protein Calorie Malnutrition:  Improving and taking supplements well. #4 H/O of PE/IVC Filter.  Also with H/O subdural hematoma requiring evacuation #5 HTN #6 CAD #7 HOH #8 OA - Uses a walker at baseline # 9 DVT Proph - Lovenox - Watch for falls with H/o Bleed #10 PT/OT/CW - ordered. #11 NSVT - Asxatic - NTD.  BMET fine. #12 - Min Anemia - follow.   LOS: 3 days   Magdalene Tardiff M 11/23/2011, 8:33 AM

## 2011-11-24 NOTE — Progress Notes (Signed)
Subjective: Complains of chest pain on the left with deep cough only.  Feels a bit better than yesterday.  Hard of hearing.  Objective: Vital signs in last 24 hours: Temp:  [97.5 F (36.4 C)-98.3 F (36.8 C)] 97.8 F (36.6 C) (12/25 0355) Pulse Rate:  [85-101] 94  (12/25 0605) Resp:  [18] 18  (12/25 0355) BP: (136-188)/(74-85) 140/74 mmHg (12/25 0605) SpO2:  [95 %-97 %] 95 % (12/25 0355) Weight:  [61 kg (134 lb 7.7 oz)] 134 lb 7.7 oz (61 kg) (12/25 0355) Weight change: 0.7 kg (1 lb 8.7 oz) Last BM Date: 11/23/11  Intake/Output from previous day: 12/24 0701 - 12/25 0700 In: 1335.7 [P.O.:870; I.V.:465.7] Out: 950 [Urine:950] Intake/Output this shift: Total I/O In: -  Out: 201 [Urine:200; Stool:1]  General appearance: alert, cooperative and no distress Ears: normal TM's and external ear canals both ears and hard of hearing Resp: bibasilar crackles, slight wheeze with expiration Cardio: regular rate and rhythm, S1, S2 normal, no murmur, click, rub or gallop Extremities: extremities normal, atraumatic, no cyanosis or edema  Lab Results:  Basename 11/23/11 0555 11/22/11 0625  WBC 7.8 10.1  HGB 12.0 10.7*  HCT 37.4 33.6*  PLT 207 192   BMET No results found for this basename: NA:2,K:2,CL:2,CO2:2,GLUCOSE:2,BUN:2,CREATININE:2,CALCIUM:2 in the last 72 hours  Studies/Results: Dg Chest 2 View  11/23/2011  *RADIOLOGY REPORT*  Clinical Data: Cough.  Shortness of breath.  Weakness.  Pneumonia.  CHEST - 2 VIEW  Comparison: 11/20/2011  Findings: Increase in small bilateral pleural effusions are seen. Bilateral lower lobe airspace disease shows mild worsening mainly in the left lung base.  Cardiomegaly stable.  IMPRESSION:  1. Increase in small bilateral pleural effusions and bilateral lower lobe airspace disease. 2.  Stable cardiomegaly.  Original Report Authenticated By: Danae Orleans, M.D.    Medications:  I have reviewed the patient's current medications. Scheduled:   .  acetaminophen  650 mg Oral BID  . amLODipine  5 mg Oral Daily  . aspirin  325 mg Oral Daily  . bimatoprost  1 drop Both Eyes QHS  . carvedilol  12.5 mg Oral BID WC  . cholecalciferol  1,000 Units Oral Daily  . divalproex  250 mg Oral QODAY  . enoxaparin  40 mg Subcutaneous Q24H  . ENSURE  237 mL Oral BID BM  . furosemide  20 mg Oral Daily  . latanoprost  1 drop Both Eyes QHS  . levofloxacin  250 mg Oral Daily  . potassium chloride  20 mEq Oral Daily  . simvastatin  20 mg Oral QHS  . DISCONTD: ceFEPime (MAXIPIME) IV  2 g Intravenous Q12H  . DISCONTD: levofloxacin (LEVAQUIN) IV  250 mg Intravenous Q24H   Continuous:   . 0.9 % NaCl with KCl 20 mEq / L 20 mL/hr at 11/22/11 1737   ZOX:[id number], zolpidem  Assessment/Plan: #1 Bibasilar Pneumonia: Slowly improving on current meds. Vancomycin not continued and WBC count continues to improve.On Levaquin orally now. #2 Chronic Atrial Fibrillation: stable, not noted on auscultation #3 Protein Calorie Malnutrition: Improving and taking supplements well.  #4 H/O of PE/IVC Filter. Also with H/O subdural hematoma requiring evacuation  #5 HTN Controlled #6 CAD OK, his chest pain is cough related only #7 HOH  #8 OA - Uses a walker at baseline  # 9 DVT Proph - Lovenox - Watch for falls with H/o Bleed  #10 PT/OT/CW - ordered.  #11 NSVT - Asxatic - NTD. BMET fine.   Dispo: possibly back to  ALF end of week.   LOS: 4 days   Devaeh Amadi W 11/24/2011, 7:58 AM

## 2011-11-25 LAB — CBC
MCV: 87.5 fL (ref 78.0–100.0)
Platelets: 261 10*3/uL (ref 150–400)
RBC: 4.24 MIL/uL (ref 3.87–5.11)
RDW: 15.8 % — ABNORMAL HIGH (ref 11.5–15.5)
WBC: 6.7 10*3/uL (ref 4.0–10.5)

## 2011-11-25 LAB — BASIC METABOLIC PANEL
CO2: 27 mEq/L (ref 19–32)
Chloride: 103 mEq/L (ref 96–112)
Creatinine, Ser: 0.68 mg/dL (ref 0.50–1.10)
GFR calc Af Amer: 85 mL/min — ABNORMAL LOW (ref >90)
Sodium: 139 mEq/L (ref 135–145)

## 2011-11-25 MED ORDER — POTASSIUM CHLORIDE CRYS ER 20 MEQ PO TBCR
20.0000 meq | EXTENDED_RELEASE_TABLET | Freq: Every day | ORAL | Status: DC
  Administered 2011-11-25 – 2011-11-27 (×3): 20 meq via ORAL
  Filled 2011-11-25 (×3): qty 1

## 2011-11-25 NOTE — Progress Notes (Signed)
Physical Therapy Treatment Patient Details Name: [name] MRN: [id number] DOB: 06-03-1916 Today's Date: 11/25/2011  PT Assessment/Plan  PT - Assessment/Plan Comments on Treatment Session: Pt very pleasant and willing to participate.  PT Plan: Discharge plan remains appropriate PT Frequency: Min 3X/week Follow Up Recommendations: Home health PT Equipment Recommended: Defer to next venue PT Goals  Acute Rehab PT Goals PT Goal: Supine/Side to Sit - Progress: Progressing toward goal PT Goal: Sit to Supine/Side - Progress: Progressing toward goal PT Goal: Sit to Stand - Progress: Progressing toward goal PT Goal: Stand to Sit - Progress: Progressing toward goal PT Goal: Ambulate - Progress: Progressing toward goal  PT Treatment Precautions/Restrictions  Precautions Precautions: Fall Required Braces or Orthoses: No Restrictions Weight Bearing Restrictions: No Mobility (including Balance) Bed Mobility Bed Mobility: Yes Supine to Sit: 3: Mod assist Supine to Sit Details (indicate cue type and reason): pt pulling on therapist regardless of cues for use of rail; assist to scott EOB Sit to Supine - Left: 4: Min assist Sit to Supine - Left Details (indicate cue type and reason): minA for sequencing Transfers Sit to Stand: 4: Min assist;Without upper extremity assist;From bed Sit to Stand Details (indicate cue type and reason): cues for safe technique/hand placement and follow through Stand to Sit: To bed;4: Min assist Stand to Sit Details: cues for safe technique Ambulation/Gait Ambulation/Gait: Yes Ambulation/Gait Assistance Details (indicate cue type and reason): pt amb approx 200 ft with RW and mingaurdA; pt with very slow shuffled gait Ambulation Distance (Feet): 200 Feet Assistive device: Rolling walker Gait Pattern: Shuffle;Trunk flexed    Exercise    End of Session PT - End of Session Equipment Utilized During Treatment: Gait belt Activity Tolerance: Patient  tolerated treatment well Patient left: in bed;with call bell in reach;with family/visitor present Nurse Communication: Mobility status for transfers;Mobility status for ambulation General Behavior During Session: [facility] for tasks performed Cognition: [facility] for tasks performed  [name] 11/25/2011, 3:28 PM

## 2011-11-25 NOTE — Progress Notes (Signed)
Subjective: Doing ok this AM.  Still hard of hearing.  Breathing seems ok, has been up to the bathroom.  Not much appetite this AM  Objective: Vital signs in last 24 hours: Temp:  [97.9 F (36.6 C)-98.3 F (36.8 C)] 97.9 F (36.6 C) (12/26 0415) Pulse Rate:  [90-99] 99  (12/26 0415) Resp:  [18-19] 18  (12/26 0415) BP: (117-153)/(70-95) 153/95 mmHg (12/26 0415) SpO2:  [94 %-97 %] 94 % (12/26 0415) Weight:  [60.4 kg (133 lb 2.5 oz)] 133 lb 2.5 oz (60.4 kg) (12/26 0415) Weight change: -0.6 kg (-1 lb 5.2 oz) Last BM Date: 11/23/11  Intake/Output from previous day: 12/25 0701 - 12/26 0700 In: 1020.7 [P.O.:540; I.V.:480.7] Out: 1452 [Urine:1450; Stool:2] Intake/Output this shift: Total I/O In: 240 [P.O.:240] Out: 501 [Urine:500; Stool:1]  General appearance: alert, cooperative and no distress  Ears: normal TM's and external ear canals both ears and hard of hearing  Resp: bibasilar crackles, slight wheeze with expiration Unchanged from prior day Cardio: regular rate and rhythm, S1, S2 normal, no murmur, click, rub or gallop  Extremities: extremities normal, atraumatic, no cyanosis or edema   Lab Results:  Basename 11/25/11 0600 11/23/11 0555  WBC 6.7 7.8  HGB 12.1 12.0  HCT 37.1 37.4  PLT 261 207   BMET  Basename 11/25/11 0600  NA 139  K 4.0  CL 103  CO2 27  GLUCOSE 88  BUN 15  CREATININE 0.68  CALCIUM 9.4    Studies/Results: Dg Chest 2 View  11/23/2011  *RADIOLOGY REPORT*  Clinical Data: Cough.  Shortness of breath.  Weakness.  Pneumonia.  CHEST - 2 VIEW  Comparison: 11/20/2011  Findings: Increase in small bilateral pleural effusions are seen. Bilateral lower lobe airspace disease shows mild worsening mainly in the left lung base.  Cardiomegaly stable.  IMPRESSION:  1. Increase in small bilateral pleural effusions and bilateral lower lobe airspace disease. 2.  Stable cardiomegaly.  Original Report Authenticated By: Danae Orleans, M.D.    Medications:  I have  reviewed the patient's current medications. Scheduled:   . acetaminophen  650 mg Oral BID  . amLODipine  5 mg Oral Daily  . aspirin  325 mg Oral Daily  . bimatoprost  1 drop Both Eyes QHS  . carvedilol  12.5 mg Oral BID WC  . cholecalciferol  1,000 Units Oral Daily  . divalproex  250 mg Oral QODAY  . enoxaparin  40 mg Subcutaneous Q24H  . ENSURE  237 mL Oral BID BM  . furosemide  20 mg Oral Daily  . latanoprost  1 drop Both Eyes QHS  . levofloxacin  250 mg Oral Daily  . potassium chloride  20 mEq Oral Daily  . simvastatin  20 mg Oral QHS  . DISCONTD: potassium chloride  20 mEq Oral Daily   Continuous:   . 0.9 % NaCl with KCl 20 mEq / L 20 mL/hr at 11/22/11 1737   WUJ:[id number], zolpidem  Assessment/Plan: #1 Bibasilar Pneumonia: Slowly improving on current meds.  WBC count continues to improve.On Levaquin orally now. Will recheck CXR in AM #2 Chronic Atrial Fibrillation: stable, not noted on auscultation  #3 Protein Calorie Malnutrition: Not as good today, still getting supplements #4 H/O of PE/IVC Filter. Also with H/O subdural hematoma requiring evacuation  #5 HTN Controlled  #6 CAD OK, her chest pain is cough related only  #7 HOH  #8 OA - Uses a walker at baseline  # 9 DVT Proph - Lovenox - Watch for  falls with H/o Bleed  #10 PT/OT/CW - ordered.  #11 NSVT - Ataxic - NTD. BMET fine.  Dispo: possibly back to ALF end of week.   LOS: 5 days   [name] 11/25/2011, 9:08 AM

## 2011-11-26 ENCOUNTER — Inpatient Hospital Stay (HOSPITAL_COMMUNITY): Payer: Medicare HMO

## 2011-11-26 LAB — CBC
HCT: 36.2 % (ref 36.0–46.0)
Platelets: 254 10*3/uL (ref 150–400)
RBC: 4.15 MIL/uL (ref 3.87–5.11)
RDW: 15.8 % — ABNORMAL HIGH (ref 11.5–15.5)
WBC: 7.9 10*3/uL (ref 4.0–10.5)

## 2011-11-26 LAB — BASIC METABOLIC PANEL
CO2: 26 mEq/L (ref 19–32)
Chloride: 104 mEq/L (ref 96–112)
GFR calc Af Amer: 81 mL/min — ABNORMAL LOW (ref >90)
Potassium: 4.2 mEq/L (ref 3.5–5.1)

## 2011-11-26 NOTE — Progress Notes (Signed)
Subjective: Feeling better.  Still weak.  No cough, shortness of breath.  Left leg pain.  Daughter at bedside.  Objective: Vital signs in last 24 hours: Temp:  [97.6 F (36.4 C)-98.3 F (36.8 C)] 97.9 F (36.6 C) (12/27 0430) Pulse Rate:  [74-106] 102  (12/27 0617) Resp:  [16-18] 18  (12/27 0430) BP: (114-167)/(62-88) 150/88 mmHg (12/27 0617) SpO2:  [92 %-96 %] 96 % (12/27 0430) Weight:  [56.745 kg (125 lb 1.6 oz)] 125 lb 1.6 oz (56.745 kg) (12/27 0430) Weight change: -3.655 kg (-8 lb 0.9 oz) Last BM Date: 11/25/11  CBG (last 3)  No results found for this basename: GLUCAP:3 in the last 72 hours  Intake/Output from previous day: 12/26 0701 - 12/27 0700 In: 1240 [P.O.:1000; I.V.:240] Out: 2028 [Urine:2025; Stool:3] Intake/Output this shift:    General appearance: alert and no distress Eyes: no scleral icterus Throat: oropharynx moist without erythema Resp: clear to auscultation bilaterally Cardio: irregularly irregular rhythm GI: soft, non-tender; bowel sounds normal; no masses,  no organomegaly Extremities: no clubbing, cyanosis or edema Neurologic: no focal deficits   Lab Results:  Basename 11/25/11 0600  NA 139  K 4.0  CL 103  CO2 27  GLUCOSE 88  BUN 15  CREATININE 0.68  CALCIUM 9.4  MG --  PHOS --   No results found for this basename: AST:2,ALT:2,ALKPHOS:2,BILITOT:2,PROT:2,ALBUMIN:2 in the last 72 hours  Basename 11/26/11 0611 11/25/11 0600  WBC 7.9 6.7  NEUTROABS -- --  HGB 11.9* 12.1  HCT 36.2 37.1  MCV 87.2 87.5  PLT 254 261   Lab Results  Component Value Date   INR 0.9 07/13/2009   No results found for this basename: CKTOTAL:3,CKMB:3,CKMBINDEX:3,TROPONINI:3 in the last 72 hours No results found for this basename: TSH,T4TOTAL,FREET3,T3FREE,THYROIDAB in the last 72 hours No results found for this basename: VITAMINB12:2,FOLATE:2,FERRITIN:2,TIBC:2,IRON:2,RETICCTPCT:2 in the last 72 hours  Studies/Results: No results found.   Medications:  Scheduled:   . acetaminophen  650 mg Oral BID  . amLODipine  5 mg Oral Daily  . aspirin  325 mg Oral Daily  . bimatoprost  1 drop Both Eyes QHS  . carvedilol  12.5 mg Oral BID WC  . cholecalciferol  1,000 Units Oral Daily  . divalproex  250 mg Oral QODAY  . enoxaparin  40 mg Subcutaneous Q24H  . ENSURE  237 mL Oral BID BM  . furosemide  20 mg Oral Daily  . latanoprost  1 drop Both Eyes QHS  . levofloxacin  250 mg Oral Daily  . potassium chloride  20 mEq Oral Daily  . simvastatin  20 mg Oral QHS   Continuous:   . 0.9 % NaCl with KCl 20 mEq / L 20 mL/hr at 11/25/11 1008    Assessment/Plan: Principal Problem: 1. *Pneumonia- repeat CXR today to evaluate pleural effusion.  Continue Levaquin. Active Problems: 2. Hypertension-slightly elevated today.  Continue current treatment. 3. Atrial fibrillation- continue ASA, rate control.  No anticoagulation due to fall risk/history of CNS bleed. 4. Hip pain, left- continue Tylenol.  Avoid sedating medications.  X-rays negative. 5. Gait instability- continue PT/OT. 6. Disposition- anticipate discharge to [facility] ALF tomorrow with HHPT/OT.    LOS: 6 days   [name],[name] 11/26/2011, 7:06 AM

## 2011-11-26 NOTE — Progress Notes (Signed)
Occupational Therapy Treatment Patient Details Name: [name] MRN: [id number] DOB: 1916/07/04 Today's Date: 11/26/2011  OT Assessment/Plan OT Assessment/Plan Comments on Treatment Session: Pt did very well, needs min instructional cueing to utilize walker correctly and to recall the need to push up from the arm rests when attempting sit to stand.  No LOB noted when ambulating to and from the bathroom.  Does continue to need min assist for sit to stand form bedside chair however.        OT Plan: Discharge plan remains appropriate OT Frequency: Min 2X/week Follow Up Recommendations: Home health OT Equipment Recommended: None recommended by OT OT Goals ADL Goals ADL Goal: Grooming - Progress: Met ADL Goal: Lower Body Bathing - Progress: Not addressed ADL Goal: Lower Body Dressing - Progress: Not addressed ADL Goal: Toilet Transfer - Progress: Progressing toward goals  OT Treatment Precautions/Restrictions  Precautions Precautions: Fall Required Braces or Orthoses: No Restrictions Weight Bearing Restrictions: No   ADL ADL Eating/Feeding: Not assessed Grooming: Performed;Supervision/safety Where Assessed - Grooming: Standing at sink Lower Body Bathing: Performed;Supervision/safety Lower Body Bathing Details (indicate cue type and reason): Pt stood to wash perianal area only after toileting. Where Assessed - Lower Body Bathing: Standing at sink Upper Body Dressing: Not assessed Lower Body Dressing: Not assessed Toilet Transfer: Performed;Minimal assistance Toilet Transfer Details (indicate cue type and reason): Pt utilized rolling walker to ambulate to the 3:1 over the toilet. Toilet Transfer Method: Proofreader: Raised toilet seat with arms (or 3-in-1 over toilet) Toileting - Clothing Manipulation: Minimal assistance Toileting - Clothing Manipulation Details (indicate cue type and reason): Pt required assistance to manage robe and gown. Where  Assessed - Toileting Clothing Manipulation: Sit to stand from 3-in-1 or toilet Toileting - Hygiene: Performed;Supervision/safety Where Assessed - Toileting Hygiene: Sit on 3-in-1 or toilet Tub/Shower Transfer: Not assessed Tub/Shower Transfer Method: Not assessed Equipment Used: Rolling walker (3:1) ADL Comments: Pt overall min assist to supervision for toileting and groooming tasks.  Needs min instructional cues to use walker correctly and position UEs on arm rests for transitions sit to stand. Mobility  Bed Mobility Bed Mobility: No Exercises    End of Session OT - End of Session Equipment Utilized During Treatment: Other (comment) (rolling walker) Activity Tolerance: Patient tolerated treatment well Patient left: in chair;with call bell in reach General Behavior During Session: [facility] for tasks performed Cognition: [facility] for tasks performed  Aryn Kops OTR/L 11/26/2011, 4:09 PM Pager number [phone]

## 2011-11-27 LAB — BASIC METABOLIC PANEL WITH GFR
BUN: 16 mg/dL (ref 6–23)
CO2: 26 meq/L (ref 19–32)
Calcium: 9.1 mg/dL (ref 8.4–10.5)
Chloride: 104 meq/L (ref 96–112)
Creatinine, Ser: 0.72 mg/dL (ref 0.50–1.10)
GFR calc Af Amer: 83 mL/min — ABNORMAL LOW
GFR calc non Af Amer: 72 mL/min — ABNORMAL LOW
Glucose, Bld: 96 mg/dL (ref 70–99)
Potassium: 4 meq/L (ref 3.5–5.1)
Sodium: 138 meq/L (ref 135–145)

## 2011-11-27 LAB — CBC
HCT: 37.5 % (ref 36.0–46.0)
Hemoglobin: 12.1 g/dL (ref 12.0–15.0)
MCH: 28.3 pg (ref 26.0–34.0)
MCHC: 32.3 g/dL (ref 30.0–36.0)
MCV: 87.6 fL (ref 78.0–100.0)
Platelets: 259 10*3/uL (ref 150–400)
RBC: 4.28 MIL/uL (ref 3.87–5.11)
RDW: 15.9 % — ABNORMAL HIGH (ref 11.5–15.5)
WBC: 6.6 10*3/uL (ref 4.0–10.5)

## 2011-11-27 LAB — CULTURE, BLOOD (ROUTINE X 2)
Culture  Setup Time: 201212220406
Culture  Setup Time: 201212220406
Culture: NO GROWTH

## 2011-11-27 MED ORDER — FUROSEMIDE 20 MG PO TABS: 40.0000 mg | ORAL_TABLET | Freq: Every day | ORAL | Status: DC

## 2011-11-27 MED ORDER — LEVOFLOXACIN 250 MG PO TABS: 250.0000 mg | ORAL_TABLET | Freq: Every day | ORAL | Status: AC

## 2011-11-27 NOTE — Progress Notes (Signed)
Physical Therapy Treatment Patient Details Name: [name] MRN: [id number] DOB: January 17, 1916 Today's Date: 11/27/2011  PT Assessment/Plan  PT - Assessment/Plan Comments on Treatment Session: Pt fatigued today, but agreeable to PT. Pt progressing with mobility, but limited by generalized weakness PT Plan: Discharge plan remains appropriate Follow Up Recommendations: Other (comment) (ALF with HHPT) Equipment Recommended: Defer to next venue PT Goals  Acute Rehab PT Goals PT Goal: Supine/Side to Sit - Progress: Other (comment) (Not addressed this tx) PT Goal: Sit to Supine/Side - Progress: Other (comment) (Not addressed this tx) PT Goal: Sit to Stand - Progress: Progressing toward goal PT Goal: Stand to Sit - Progress: Progressing toward goal PT Goal: Ambulate - Progress: Progressing toward goal  PT Treatment Precautions/Restrictions  Precautions Precautions: Fall Required Braces or Orthoses: No Restrictions Weight Bearing Restrictions: No Mobility (including Balance) Bed Mobility Bed Mobility: No Transfers Sit to Stand: 4: Min assist;With upper extremity assist;From chair/3-in-1 Sit to Stand Details (indicate cue type and reason): Cues for hand placement, but pt continues to place hands on RW regardless, A for lifting Stand to Sit: 4: Min assist;With upper extremity assist;To toilet Stand to Sit Details: Cues for hand placement on handrail, A to control descent Ambulation/Gait Ambulation/Gait: Yes Ambulation/Gait Assistance: 4: Min assist Ambulation/Gait Assistance Details (indicate cue type and reason): Pt amb 2x20' with cues for posture and RW proximity to self Ambulation Distance (Feet): 20 Feet (x2) Assistive device: Rolling walker Gait Pattern: Shuffle;Trunk flexed Stairs: No Wheelchair Mobility Wheelchair Mobility: No  Posture/Postural Control Posture/Postural Control: No significant limitations Balance Balance Assessed: Yes Static Standing Balance Static  Standing - Balance Support: Right upper extremity supported;Left upper extremity supported (Alternating and without support) Static Standing - Level of Assistance: 4: Min assist Static Standing - Comment/# of Minutes: Min-guard A once pt up and steady for 4 min while washing body, occasionally used single UE for support Exercise  General Exercises - Lower Extremity Ankle Circles/Pumps: 20 reps;Seated;Both;Strengthening;AROM Long Arc Quad: 15 reps;Seated;Both;Strengthening;AROM Hip Flexion/Marching: 10 reps;Seated;Both;Strengthening;AROM End of Session PT - End of Session Equipment Utilized During Treatment: Gait belt Activity Tolerance: Patient limited by fatigue (Pt agreeable to PT, but limited due to fatigue and DC prep) Patient left: in chair;with call bell in reach General Behavior During Session: [facility] for tasks performed Cognition: [facility] for tasks performed  [facility] [place], [place] [phone]  11/27/2011, 3:29 PM

## 2011-11-27 NOTE — Progress Notes (Signed)
   CARE MANAGEMENT NOTE 11/27/2011  Patient:  [name], [name]   Account Number:  [id number]  Date Initiated:  11/23/2011  Documentation initiated by:  Ronny Flurry  Subjective/Objective Assessment:   DX: PNA     Action/Plan:   from Assisted Living  11/27/2011 Pt for return to ALF, [facility], orders for HHPT and HHOT and face to face faxed to that facility.   Anticipated DC Date:  11/27/2011   Anticipated DC Plan:  ASSISTED LIVING / REST HOME  In-house referral  Clinical Social Worker         Choice offered to / List presented to:             Status of service:  Completed, signed off Medicare Important Message given?   (If response is "NO", the following Medicare IM given date fields will be blank) Date Medicare IM given:   Date Additional Medicare IM given:    Discharge Disposition:    Per UR Regulation:  Reviewed for med. necessity/level of care/duration of stay  Comments:  11/27/2011 Pt for return to [facility], PT and OT are available at that facility. Order for HHPT/OT and Face to Face form faxed to that agency. Johny Shock RN MPH Case Manager [phone]

## 2011-11-27 NOTE — Progress Notes (Addendum)
Clinical Social Work has been consulted due to patient being from a facility and patient now ready to return to facility.  Full assessment is in shadow chart along with completed assessment.  Patient is from [facility] and can return per report, as long as family is agreeable.  Called patient son, but was unable to reach and left message.  Patient seen and is agreeable to return, but is slightly confused and will need to reach son before patient can dc.  Patient FL2 has not been signed and thus working to get in touch with the doctor to sign.  Plan is dc to ALF today if FL2 signed and son is agreeable.  Aware of dc order and have discussed with facility.  Will continue to follow and assist with discharge.  Ashley Jacobs, MSW LCSW [phone]  Coverage for Genelle Bal   At 11:38 am CSW faxed over [facility] and spoke with [facility] about MD signing and returning.  Got in contact with patient son who is agreeable and aware that patient is aware of dc back to ALF.  Will fax all required information to ALF and will arrange EMS for transport.  No other needs from the son at this time.  Will alert RN and floor of dc.    Ashley Jacobs, MSW LCSW [phone]

## 2011-11-27 NOTE — Progress Notes (Signed)
Clinical Social Work received feedback to get clarification on Depakote medication order.  Spoke with Dr. Clelia Croft and clarified the medication and updated the FL2.  No other concerns with plan to dc today.  Will fax over new FL2 and received signed copy.  Anticipate dc this afternoon.  Son notified and agreeable.  No other needs at this time.  Ashley Jacobs, MSW LCSW [phone]

## 2011-11-27 NOTE — Discharge Summary (Signed)
DISCHARGE SUMMARY  [name]  MR#: [id number]  DOB:08-07-1916  Date of Admission: 11/20/2011 Date of Discharge: 11/27/2011  Attending Physician:Lucile Hillmann,W DOUGLAS  Patient's QMV:HQIO, Robert Bellow Consults: None  Discharge Diagnoses: Principal Problem:  *Pneumonia Active Problems: Generalized Weakness   Hypertension  Atrial fibrillation  Hip pain, left  Gait instability  Bilateral Pleural Effusions  Past Medical History  Diagnosis Date  . Arthritis   . Angina   . Coronary artery disease   . Hypertension   . Myocardial infarction   . Dysrhythmia   . DVT (deep venous thrombosis)   . Subdural hematoma   . Glaucoma   . Hearing loss   . Atrial fibrillation     Discharge Medications: Current Discharge Medication List    START taking these medications   Details  levofloxacin (LEVAQUIN) 250 MG tablet Take 1 tablet (250 mg total) by mouth daily. Qty: 3 tablet, Refills: 0      CONTINUE these medications which have CHANGED   Details  furosemide (LASIX) 20 MG tablet Take 2 tablets (40 mg total) by mouth daily. Qty: 30 tablet, Refills: 6      CONTINUE these medications which have NOT CHANGED   Details  !! acetaminophen (TYLENOL) 325 MG tablet Take 650 mg by mouth every 6 (six) hours as needed. For pain     !! acetaminophen (TYLENOL) 325 MG tablet Take 650 mg by mouth 2 (two) times daily.      amLODipine (NORVASC) 5 MG tablet Take 5 mg by mouth daily.      aspirin 325 MG tablet Take 325 mg by mouth daily.      bimatoprost (LUMIGAN) 0.01 % SOLN Place 1 drop into both eyes at bedtime.      Calcium Carbonate (CALTRATE 600 PO) Take 1 tablet by mouth daily.      carvedilol (COREG) 12.5 MG tablet Take 12.5 mg by mouth 2 (two) times daily with a meal.      cholecalciferol (VITAMIN D) 1000 UNITS tablet Take 1,000 Units by mouth daily.      divalproex (DEPAKOTE) 250 MG DR tablet Take 250 mg by mouth every other day.      latanoprost (XALATAN) 0.005 % ophthalmic  solution Place 1 drop into both eyes at bedtime.      Nutritional Supplements (ENSURE PO) Take 1 each by mouth 2 (two) times daily. Drink one can twice a day     potassium chloride SA (K-DUR,KLOR-CON) 20 MEQ tablet Take 20 mEq by mouth daily.      simvastatin (ZOCOR) 20 MG tablet Take 20 mg by mouth at bedtime.      Wheat Dextrin (BENEFIBER DRINK MIX PO) Take 1 application by mouth once a week. Mix 1 tablespoon with 18 oz. Of water      !! - Potential duplicate medications found. Please discuss with provider.      Hospital Procedures: Dg Chest 2 View  11/26/2011  *RADIOLOGY REPORT*  Clinical Data: Pneumonia.  CHEST - 2 VIEW  Comparison: Chest x-ray 11/23/2011.  Findings: The cardiac silhouette, mediastinal and hilar contours are stable.  There are persistent bilateral pleural effusions and overlying atelectasis or infiltrates.  No edema or pneumothorax. The bony thorax is intact.  IMPRESSION: Persistent bilateral pleural effusions with overlying atelectasis or possible infiltrates.  Original Report Authenticated By: P. Loralie Champagne, M.D.   Dg Chest 2 View  11/23/2011  *RADIOLOGY REPORT*  Clinical Data: Cough.  Shortness of breath.  Weakness.  Pneumonia.  CHEST - 2  VIEW  Comparison: 11/20/2011  Findings: Increase in small bilateral pleural effusions are seen. Bilateral lower lobe airspace disease shows mild worsening mainly in the left lung base.  Cardiomegaly stable.  IMPRESSION:  1. Increase in small bilateral pleural effusions and bilateral lower lobe airspace disease. 2.  Stable cardiomegaly.  Original Report Authenticated By: Danae Orleans, M.D.   Dg Chest 2 View  11/20/2011  *RADIOLOGY REPORT*  Clinical Data: Chest pain.  CHEST - 2 VIEW  Comparison: 09/23/2010  Findings: Cardiomegaly is stable.  Bilateral lower lobe airspace disease is new since previous study, may be due to edema or pneumonia. No significant pleural effusion identified.  IMPRESSION:  1.  Bilateral lower lobe airspace  disease, which may be due to pulmonary edema or multilobar pneumonia. 2.  Stable cardiomegaly.  Original Report Authenticated By: Danae Orleans, M.D.   Dg Hip Complete Left  11/20/2011  *RADIOLOGY REPORT*  Clinical Data: Fall.  Left hip injury and pain.  LEFT HIP - COMPLETE 2+ VIEW  Comparison: None.  Findings: No evidence of left hip fracture or dislocation.  No acetabular or pelvic fracture identified.  Mild left hip DJD is seen with subchondral geode in the femoral head.  Generalized osteopenia is noted as well as peripheral vascular calcification.  IMPRESSION:  1.  No acute findings. 2.  Mild left hip DJD.  Original Report Authenticated By: Danae Orleans, M.D.    History of Present Illness: (From Dr. Norval Gable admission H&P)- Patient is a 77 year old woman who is a resident [facility] assisted living facility and was sent to the emergency room because she was somewhat lethargic and had slipped to the floor at the facility. She did not have apparent physical injury. She has been having some cough lately without fever or chills or shortness of breath. The workup in the emergency room was most notable for a mildly elevated white blood cell count of 16,000 without fever, and a chest x-ray that showed bibasilar airspace disease consistent with pneumonia. She has been started on broad-spectrum antibiotics by emergency room personnel. History was provided by the patient and her son who was in attendance. Her medical history is significant for hypertension, glaucoma, chronic atrial fibrillation, pulmonary embolism with IVC filter placement, subdural hematoma requiring evacuation, coronary artery disease, and decreased hearing. She also has osteoarthritis in the spine and left hip and generally ambulates with a walker   Hospital Course: [name] was admitted to a medical bed. She was initially placed on broad-spectrum cefepime and transitioned to Levaquin to complete a seven-day course of  antibiotics for pneumonia.  She was gently hydrated. Her leukocytosis improved with this treatment. Followup chest x-rays did show persistent pleural effusions bilaterally. However, she felt much improved with decreased shortness of breath and improved mental status/energy level. She remains weak but physical therapy and occupational therapy have recommended home health PT/OT at her assisted living facility. Her daughter has verified that [facility] can provide the care that she needs and is comfortable with her discharge..   Day of Discharge Exam BP 146/88  Pulse 93  Temp(Src) 98.1 F (36.7 C) (Oral)  Resp 16  Ht 5\' 6"  (1.676 m)  Wt 56.745 kg (125 lb 1.6 oz)  BMI 20.19 kg/m2  SpO2 96%  Physical Exam: General appearance: alert and Chronically ill Eyes: no scleral icterus Throat: oropharynx moist without erythema Resp: diminished breath sounds bibasilar Cardio: irregularly irregular rhythm GI: soft, non-tender; bowel sounds normal; no masses,  no organomegaly Extremities: no  clubbing, cyanosis or edema  Discharge Labs:  [facility] 11/27/11 0610 11/26/11 0611  NA 138 139  K 4.0 4.2  CL 104 104  CO2 26 26  GLUCOSE 96 94  BUN 16 15  CREATININE 0.72 0.77  CALCIUM 9.1 9.4  MG -- --  PHOS -- --   No results found for this basename: AST:2,ALT:2,ALKPHOS:2,BILITOT:2,PROT:2,ALBUMIN:2 in the last 72 hours  Basename 11/27/11 0610 11/26/11 0611  WBC 6.6 7.9  NEUTROABS -- --  HGB 12.1 11.9*  HCT 37.5 36.2  MCV 87.6 87.2  PLT 259 254   Lab Results  Component Value Date   INR 0.9 07/13/2009   No results found for this basename: CKTOTAL:3,CKMB:3,CKMBINDEX:3,TROPONINI:3 in the last 72 hours No results found for this basename: TSH,T4TOTAL,FREET3,T3FREE,THYROIDAB in the last 72 hours No results found for this basename: VITAMINB12:2,FOLATE:2,FERRITIN:2,TIBC:2,IRON:2,RETICCTPCT:2 in the last 72 hours  Discharge instructions: Discharge Orders    Future Orders Please Complete By  Expires   Diet - low sodium heart healthy      Increase activity slowly      Discharge instructions      Comments:   Cough increased shortness of breath, fever greater than 101.5, cough, or swelling.      Disposition: To [facility] assisted-living facility  Follow-up Appts: Follow-up with Dr. Clelia Croft at [facility] in 1 week.  Call for appointment.  Condition on Discharge: Improved  Tests Needing Follow-up: Followup chest x-ray in 2-3 weeks  Time with discharge activities: 35 minutes  Signed: Jakyia Gaccione,W DOUGLAS 11/27/2011, 7:59 AM

## 2012-10-30 ENCOUNTER — Emergency Department (HOSPITAL_BASED_OUTPATIENT_CLINIC_OR_DEPARTMENT_OTHER)
Admission: EM | Admit: 2012-10-30 | Discharge: 2012-10-31 | Disposition: A | Discharge status: Other Acute Inpatient Hospital | Payer: Medicare HMO | Attending: Emergency Medicine | Admitting: Emergency Medicine

## 2012-10-30 ENCOUNTER — Emergency Department (HOSPITAL_BASED_OUTPATIENT_CLINIC_OR_DEPARTMENT_OTHER): Payer: Medicare HMO

## 2012-10-30 ENCOUNTER — Encounter (HOSPITAL_BASED_OUTPATIENT_CLINIC_OR_DEPARTMENT_OTHER): Payer: Self-pay | Admitting: *Deleted

## 2012-10-30 DIAGNOSIS — Z86718 Personal history of other venous thrombosis and embolism: Secondary | ICD-10-CM | POA: Insufficient documentation

## 2012-10-30 DIAGNOSIS — K5900 Constipation, unspecified: Secondary | ICD-10-CM | POA: Insufficient documentation

## 2012-10-30 DIAGNOSIS — Z8679 Personal history of other diseases of the circulatory system: Secondary | ICD-10-CM | POA: Insufficient documentation

## 2012-10-30 DIAGNOSIS — Z7982 Long term (current) use of aspirin: Secondary | ICD-10-CM | POA: Insufficient documentation

## 2012-10-30 DIAGNOSIS — I2510 Atherosclerotic heart disease of native coronary artery without angina pectoris: Secondary | ICD-10-CM | POA: Insufficient documentation

## 2012-10-30 DIAGNOSIS — Z79899 Other long term (current) drug therapy: Secondary | ICD-10-CM | POA: Insufficient documentation

## 2012-10-30 DIAGNOSIS — I252 Old myocardial infarction: Secondary | ICD-10-CM | POA: Insufficient documentation

## 2012-10-30 DIAGNOSIS — M25559 Pain in unspecified hip: Secondary | ICD-10-CM | POA: Insufficient documentation

## 2012-10-30 DIAGNOSIS — M87059 Idiopathic aseptic necrosis of unspecified femur: Secondary | ICD-10-CM

## 2012-10-30 DIAGNOSIS — I10 Essential (primary) hypertension: Secondary | ICD-10-CM | POA: Insufficient documentation

## 2012-10-30 DIAGNOSIS — Z8739 Personal history of other diseases of the musculoskeletal system and connective tissue: Secondary | ICD-10-CM | POA: Insufficient documentation

## 2012-10-30 MED ORDER — HYDROCODONE-ACETAMINOPHEN 5-325 MG PO TABS: 1.0000 | ORAL_TABLET | ORAL | Status: AC | PRN

## 2012-10-30 MED ORDER — HYDROCODONE-ACETAMINOPHEN 5-325 MG PO TABS
1.0000 | ORAL_TABLET | Freq: Once | ORAL | Status: AC
  Administered 2012-10-30: 1 via ORAL
  Filled 2012-10-30: qty 1

## 2012-10-30 NOTE — ED Notes (Signed)
[facility] called for transport back to [facility]. RN aware

## 2012-10-30 NOTE — ED Provider Notes (Signed)
History  This chart was scribed for Carleene Cooper III, MD by Ardeen Jourdain, ED Scribe. This patient was seen in room MH11/MH11 and the patient's care was started at 2113.  CSN: [id number]  Arrival date & time 10/30/12  2107   None     Chief Complaint  Patient presents with  . Back Pain     The history is provided by the patient and a relative. No language interpreter was used.    [name] is a 78 y.o. female brought in by ambulance, who presents to the Emergency Department complaining of lower back pain as well as hip pain. Her son states she has a h/o constipation and has a change in her urinary habits after changing her dosage of diuretics. She is a pt of [name]. Her son denies the pt has fever, ear pain, sore throat, visual disturbance, cough, SOB, CP, abdominal pain, nausea, emesis, diarrhea, difficulty urinating, rash, seizures and syncope as associated symptoms. Her son states he went to visit her this afternoon when she started having the pain. Her son requested she be sent to the ED because of this pain. He denies knowing of any injury that could have caused the pt's pain. She was given 1 Vicodin prior to arrival with no relief.  She has a h/o CAD, MI, DVT, HTN, arthritis and chronic pain issues. She denies smoking and alcohol use.     Past Medical History  Diagnosis Date  . Arthritis   . Angina   . Coronary artery disease   . Hypertension   . Myocardial infarction   . Dysrhythmia   . DVT (deep venous thrombosis)   . Subdural hematoma   . Glaucoma   . Hearing loss   . Atrial fibrillation     Past Surgical History  Procedure Date  . Subdural hematoma evacuation via craniotomy     No family history on file.  History  Substance Use Topics  . Smoking status: Never Smoker   . Smokeless tobacco: Not on file  . Alcohol Use: No   No OB history available.  Review of Systems  Constitutional: Negative for fever.  HENT: Negative for ear pain and sore  throat.   Eyes: Negative for visual disturbance.  Respiratory: Negative for cough.   Cardiovascular: Negative for chest pain.  Gastrointestinal: Positive for constipation. Negative for nausea, vomiting, abdominal pain and diarrhea.  Genitourinary: Negative for difficulty urinating.  Musculoskeletal: Positive for back pain.  Skin: Negative for rash.  Neurological: Negative for seizures and syncope.  All other systems reviewed and are negative.    Allergies  Review of patient's allergies indicates no known allergies.  Home Medications   Current Outpatient Rx  Name  Route  Sig  Dispense  Refill  . ACETAMINOPHEN 325 MG PO TABS   Oral   Take 650 mg by mouth every 6 (six) hours as needed. For pain          . ACETAMINOPHEN 325 MG PO TABS   Oral   Take 650 mg by mouth 2 (two) times daily.           Marland Kitchen AMLODIPINE BESYLATE 5 MG PO TABS   Oral   Take 5 mg by mouth daily.           . ASPIRIN 325 MG PO TABS   Oral   Take 325 mg by mouth daily.           Marland Kitchen BIMATOPROST 0.01 % OP SOLN  Both Eyes   Place 1 drop into both eyes at bedtime.           Marland Kitchen CALTRATE 600 PO   Oral   Take 1 tablet by mouth daily.           Marland Kitchen CARVEDILOL 12.5 MG PO TABS   Oral   Take 12.5 mg by mouth 2 (two) times daily with a meal.           . VITAMIN D 1000 UNITS PO TABS   Oral   Take 1,000 Units by mouth daily.           Marland Kitchen DIVALPROEX SODIUM 250 MG PO TBEC   Oral   Take 250 mg by mouth every other day.           . FUROSEMIDE 20 MG PO TABS   Oral   Take 2 tablets (40 mg total) by mouth daily.   30 tablet   6   . LATANOPROST 0.005 % OP SOLN   Both Eyes   Place 1 drop into both eyes at bedtime.           . ENSURE PO   Oral   Take 1 each by mouth 2 (two) times daily. Drink one can twice a day          . POTASSIUM CHLORIDE CRYS ER 20 MEQ PO TBCR   Oral   Take 20 mEq by mouth daily.           Marland Kitchen SIMVASTATIN 20 MG PO TABS   Oral   Take 20 mg by mouth at bedtime.             . BENEFIBER DRINK MIX PO   Oral   Take 1 application by mouth once a week. Mix 1 tablespoon with 18 oz. Of water            Triage Vitals: BP 198/90  Pulse 75  Temp 98.5 F (36.9 C) (Oral)  Resp 16  SpO2 95%  Physical Exam  Nursing note and vitals reviewed. Constitutional: She is oriented to person, place, and time. She appears well-developed and well-nourished. No distress.       Deaf  HENT:  Head: Normocephalic and atraumatic.  Right Ear: External ear normal.  Left Ear: External ear normal.  Mouth/Throat: Oropharynx is clear and moist. No oropharyngeal exudate.  Eyes: Conjunctivae normal and EOM are normal. Pupils are equal, round, and reactive to light.  Neck: Normal range of motion. Neck supple. No tracheal deviation present.       No deformities, no Caroid bruit   Cardiovascular: Normal rate, regular rhythm and normal heart sounds.   Pulmonary/Chest: Effort normal and breath sounds normal. No respiratory distress.  Abdominal: Soft. Bowel sounds are normal. She exhibits no distension. There is no tenderness.  Musculoskeletal: Normal range of motion. She exhibits tenderness. She exhibits no edema.       Localized pain to left lumbar region, no deformities, sitting up reproduced the pain, left him was unable to bend due to reproducible pain upon movement   Neurological: She is alert and oriented to person, place, and time.  Skin: Skin is warm and dry.  Psychiatric: She has a normal mood and affect. Her behavior is normal.    ED Course  Procedures (including critical care time)  DIAGNOSTIC STUDIES: Oxygen Saturation is 95% on room air, adequate by my interpretation.    COORDINATION OF CARE:  9:22 PM: Discussed treatment plan which includes  a x-ray of the hip and spine with pt and her son at bedside and both agreed to plan.  11:27 PM Results for orders placed during the hospital encounter of 11/20/11  CBC      Component Value Range   WBC 16.1 (*) 4.0 - 10.5  K/uL   RBC 4.24  3.87 - 5.11 MIL/uL   Hemoglobin 12.3  12.0 - 15.0 g/dL   HCT 40.9  81.1 - 91.4 %   MCV 87.0  78.0 - 100.0 fL   MCH 29.0  26.0 - 34.0 pg   MCHC 33.3  30.0 - 36.0 g/dL   RDW 78.2 (*) 95.6 - 21.3 %   Platelets 194  150 - 400 K/uL  BASIC METABOLIC PANEL      Component Value Range   Sodium 136  135 - 145 mEq/L   Potassium 3.7  3.5 - 5.1 mEq/L   Chloride 99  96 - 112 mEq/L   CO2 25  19 - 32 mEq/L   Glucose, Bld 136 (*) 70 - 99 mg/dL   BUN 16  6 - 23 mg/dL   Creatinine, Ser 0.86  0.50 - 1.10 mg/dL   Calcium 9.4  8.4 - 57.8 mg/dL   GFR calc non Af Amer 70 (*) >90 mL/min   GFR calc Af Amer 81 (*) >90 mL/min  URINALYSIS, ROUTINE W REFLEX MICROSCOPIC      Component Value Range   Color, Urine AMBER (*) YELLOW   APPearance CLEAR  CLEAR   Specific Gravity, Urine 1.026  1.005 - 1.030   pH 5.5  5.0 - 8.0   Glucose, UA NEGATIVE  NEGATIVE mg/dL   Hgb urine dipstick LARGE (*) NEGATIVE   Bilirubin Urine SMALL (*) NEGATIVE   Ketones, ur 40 (*) NEGATIVE mg/dL   Protein, ur >469 (*) NEGATIVE mg/dL   Urobilinogen, UA 1.0  0.0 - 1.0 mg/dL   Nitrite NEGATIVE  NEGATIVE   Leukocytes, UA NEGATIVE  NEGATIVE  CARDIAC PANEL(CRET KIN+CKTOT+MB+TROPI)      Component Value Range   Total CK 68  7 - 177 U/L   CK, MB 1.3  0.3 - 4.0 ng/mL   Troponin I <0.30  <0.30 ng/mL   Relative Index RELATIVE INDEX IS INVALID  0.0 - 2.5  URINE MICROSCOPIC-ADD ON      Component Value Range   WBC, UA 0-2  <3 WBC/hpf   RBC / HPF 11-20  <3 RBC/hpf   Bacteria, UA RARE  RARE   Urine-Other MUCOUS PRESENT    CULTURE, BLOOD (ROUTINE X 2)      Component Value Range   Specimen Description BLOOD LEFT ARM     Special Requests BOTTLES DRAWN AEROBIC AND ANAEROBIC 10CC     Culture  Setup Time [id number]     Culture NO GROWTH 5 DAYS     Report Status 11/27/2011 FINAL    CULTURE, BLOOD (ROUTINE X 2)      Component Value Range   Specimen Description BLOOD RIGHT ARM     Special Requests BOTTLES DRAWN AEROBIC AND  ANAEROBIC 10CC     Culture  Setup Time [id number]     Culture NO GROWTH 5 DAYS     Report Status 11/27/2011 FINAL    LACTIC ACID, PLASMA      Component Value Range   Lactic Acid, Venous 1.1  0.5 - 2.2 mmol/L  PROCALCITONIN      Component Value Range   Procalcitonin 0.28    CBC  Component Value Range   WBC 13.1 (*) 4.0 - 10.5 K/uL   RBC 4.19  3.87 - 5.11 MIL/uL   Hemoglobin 11.8 (*) 12.0 - 15.0 g/dL   HCT 16.1  09.6 - 04.5 %   MCV 87.8  78.0 - 100.0 fL   MCH 28.2  26.0 - 34.0 pg   MCHC 32.1  30.0 - 36.0 g/dL   RDW 40.9 (*) 81.1 - 91.4 %   Platelets 187  150 - 400 K/uL  CREATININE, SERUM      Component Value Range   Creatinine, Ser 0.78  0.50 - 1.10 mg/dL   GFR calc non Af Amer 70 (*) >90 mL/min   GFR calc Af Amer 81 (*) >90 mL/min  CBC      Component Value Range   WBC 13.5 (*) 4.0 - 10.5 K/uL   RBC 4.11  3.87 - 5.11 MIL/uL   Hemoglobin 11.5 (*) 12.0 - 15.0 g/dL   HCT 78.2  95.6 - 21.3 %   MCV 88.1  78.0 - 100.0 fL   MCH 28.0  26.0 - 34.0 pg   MCHC 31.8  30.0 - 36.0 g/dL   RDW 08.6 (*) 57.8 - 46.9 %   Platelets 196  150 - 400 K/uL  COMPREHENSIVE METABOLIC PANEL      Component Value Range   Sodium 138  135 - 145 mEq/L   Potassium 3.5  3.5 - 5.1 mEq/L   Chloride 100  96 - 112 mEq/L   CO2 25  19 - 32 mEq/L   Glucose, Bld 106 (*) 70 - 99 mg/dL   BUN 18  6 - 23 mg/dL   Creatinine, Ser 6.29  0.50 - 1.10 mg/dL   Calcium 9.3  8.4 - 52.8 mg/dL   Total Protein 7.0  6.0 - 8.3 g/dL   Albumin 2.4 (*) 3.5 - 5.2 g/dL   AST 9  0 - 37 U/L   ALT 8  0 - 35 U/L   Alkaline Phosphatase 73  39 - 117 U/L   Total Bilirubin 0.5  0.3 - 1.2 mg/dL   GFR calc non Af Amer 61 (*) >90 mL/min   GFR calc Af Amer 70 (*) >90 mL/min  MRSA PCR SCREENING      Component Value Range   MRSA by PCR NEGATIVE  NEGATIVE  CBC      Component Value Range   WBC 10.1  4.0 - 10.5 K/uL   RBC 3.80 (*) 3.87 - 5.11 MIL/uL   Hemoglobin 10.7 (*) 12.0 - 15.0 g/dL   HCT 41.3 (*) 24.4 - 01.0 %   MCV 88.4  78.0  - 100.0 fL   MCH 28.2  26.0 - 34.0 pg   MCHC 31.8  30.0 - 36.0 g/dL   RDW 27.2 (*) 53.6 - 64.4 %   Platelets 192  150 - 400 K/uL  CBC      Component Value Range   WBC 7.8  4.0 - 10.5 K/uL   RBC 4.19  3.87 - 5.11 MIL/uL   Hemoglobin 12.0  12.0 - 15.0 g/dL   HCT 03.4  74.2 - 59.5 %   MCV 89.3  78.0 - 100.0 fL   MCH 28.6  26.0 - 34.0 pg   MCHC 32.1  30.0 - 36.0 g/dL   RDW 63.8 (*) 75.6 - 43.3 %   Platelets 207  150 - 400 K/uL  CBC      Component Value Range   WBC 6.7  4.0 -  10.5 K/uL   RBC 4.24  3.87 - 5.11 MIL/uL   Hemoglobin 12.1  12.0 - 15.0 g/dL   HCT 45.4  09.8 - 11.9 %   MCV 87.5  78.0 - 100.0 fL   MCH 28.5  26.0 - 34.0 pg   MCHC 32.6  30.0 - 36.0 g/dL   RDW 14.7 (*) 82.9 - 56.2 %   Platelets 261  150 - 400 K/uL  BASIC METABOLIC PANEL      Component Value Range   Sodium 139  135 - 145 mEq/L   Potassium 4.0  3.5 - 5.1 mEq/L   Chloride 103  96 - 112 mEq/L   CO2 27  19 - 32 mEq/L   Glucose, Bld 88  70 - 99 mg/dL   BUN 15  6 - 23 mg/dL   Creatinine, Ser 1.30  0.50 - 1.10 mg/dL   Calcium 9.4  8.4 - 86.5 mg/dL   GFR calc non Af Amer 73 (*) >90 mL/min   GFR calc Af Amer 85 (*) >90 mL/min  CBC      Component Value Range   WBC 7.9  4.0 - 10.5 K/uL   RBC 4.15  3.87 - 5.11 MIL/uL   Hemoglobin 11.9 (*) 12.0 - 15.0 g/dL   HCT 78.4  69.6 - 29.5 %   MCV 87.2  78.0 - 100.0 fL   MCH 28.7  26.0 - 34.0 pg   MCHC 32.9  30.0 - 36.0 g/dL   RDW 28.4 (*) 13.2 - 44.0 %   Platelets 254  150 - 400 K/uL  BASIC METABOLIC PANEL      Component Value Range   Sodium 139  135 - 145 mEq/L   Potassium 4.2  3.5 - 5.1 mEq/L   Chloride 104  96 - 112 mEq/L   CO2 26  19 - 32 mEq/L   Glucose, Bld 94  70 - 99 mg/dL   BUN 15  6 - 23 mg/dL   Creatinine, Ser 1.02  0.50 - 1.10 mg/dL   Calcium 9.4  8.4 - 72.5 mg/dL   GFR calc non Af Amer 70 (*) >90 mL/min   GFR calc Af Amer 81 (*) >90 mL/min  CBC      Component Value Range   WBC 6.6  4.0 - 10.5 K/uL   RBC 4.28  3.87 - 5.11 MIL/uL   Hemoglobin  12.1  12.0 - 15.0 g/dL   HCT 36.6  44.0 - 34.7 %   MCV 87.6  78.0 - 100.0 fL   MCH 28.3  26.0 - 34.0 pg   MCHC 32.3  30.0 - 36.0 g/dL   RDW 42.5 (*) 95.6 - 38.7 %   Platelets 259  150 - 400 K/uL  BASIC METABOLIC PANEL      Component Value Range   Sodium 138  135 - 145 mEq/L   Potassium 4.0  3.5 - 5.1 mEq/L   Chloride 104  96 - 112 mEq/L   CO2 26  19 - 32 mEq/L   Glucose, Bld 96  70 - 99 mg/dL   BUN 16  6 - 23 mg/dL   Creatinine, Ser 5.64  0.50 - 1.10 mg/dL   Calcium 9.1  8.4 - 33.2 mg/dL   GFR calc non Af Amer 72 (*) >90 mL/min   GFR calc Af Amer 83 (*) >90 mL/min   Dg Lumbar Spine Complete  10/30/2012  *RADIOLOGY REPORT*  Clinical Data: Lower back pain, worse with movement.  LUMBAR  SPINE - COMPLETE 4+ VIEW  Comparison: Abdominal radiograph performed 02/25/2011, and CT of the abdomen and pelvis performed 08/24/2009  Findings: There is no evidence of fracture or subluxation. Vertebral bodies demonstrate normal height and alignment. Intervertebral disc spaces are preserved.  The visualized neural foramina are grossly unremarkable in appearance.  The visualized bowel gas pattern is unremarkable in appearance; air and stool are noted within the colon.  The sacroiliac joints are within normal limits.  An IVC filter is noted.  Diffuse vascular calcifications are seen.  IMPRESSION:  1.  No evidence of fracture or subluxation along the lumbar spine. 2.  Diffuse vascular calcifications seen.   Original Report Authenticated By: Tonia Ghent, M.D.    Dg Hip Complete Left  10/30/2012  *RADIOLOGY REPORT*  Clinical Data: Left hip pain.  LEFT HIP - COMPLETE 2+ VIEW  Comparison: Left hip radiographs performed 11/20/2011  Findings: Since the prior study, there has been collapse of the left femoral head, with marked erosion and sclerotic change.  Part of the medial left femoral head impinges on the acetabulum, with associated focal acetabular sclerosis and mild bony remodelling.  The right hip joint is  unremarkable in appearance.  Diffuse vascular calcifications are seen.  An IVC filter is noted.  The visualized bowel gas pattern is grossly unremarkable.  IMPRESSION: Since the prior study, there has been collapse of the left femoral head, with marked erosion and sclerotic change.  Part of the medial left femoral head impinges on the acetabulum, with associated focal acetabular sclerosis and mild bony remodelling.   Original Report Authenticated By: Tonia Ghent, M.D.     11:29 PM X-rays show avascular necrosis of left hip, worse than a year ago.  LS spine x-rays good.  Advised hydrocodone-acetaminophen q4h prn pain.  Dr. Clelia Croft, her internist, should check her films and advise further treatment.      1. Avascular necrosis of femoral head     I personally performed the services described in this documentation, which was scribed in my presence. The recorded information has been reviewed and is accurate. Osvaldo Human, MD      Carleene Cooper III, MD 10/30/12 [phone]

## 2012-10-30 NOTE — ED Notes (Signed)
Went over instructions with pt and family member.

## 2012-10-30 NOTE — ED Notes (Signed)
Report given to [place], [place] at [facility]. She verbalized an understanding of the instructions and had no questions.

## 2012-10-30 NOTE — ED Notes (Signed)
Patient from [facility] ([place] [address]) brought to ED with c/o back pain.  Patient has chronic pain issues.  Patient had 1 vicodin PTA.  Son wanted her to come to ED for evaluation

## 2012-10-30 NOTE — ED Notes (Signed)
Patient transported to X-ray 

## 2015-06-28 ENCOUNTER — Emergency Department (HOSPITAL_COMMUNITY): Payer: Medicare (Managed Care)

## 2015-06-28 ENCOUNTER — Emergency Department (HOSPITAL_COMMUNITY)
Admission: EM | Admit: 2015-06-28 | Discharge: 2015-06-28 | Disposition: A | Discharge status: Home / Self Care | Payer: Medicare (Managed Care) | Attending: Emergency Medicine | Admitting: Emergency Medicine

## 2015-06-28 ENCOUNTER — Encounter (HOSPITAL_COMMUNITY): Payer: Self-pay | Admitting: *Deleted

## 2015-06-28 DIAGNOSIS — R400 Somnolence: Secondary | ICD-10-CM | POA: Diagnosis not present

## 2015-06-28 DIAGNOSIS — H409 Unspecified glaucoma: Secondary | ICD-10-CM | POA: Insufficient documentation

## 2015-06-28 DIAGNOSIS — M1990 Unspecified osteoarthritis, unspecified site: Secondary | ICD-10-CM | POA: Insufficient documentation

## 2015-06-28 DIAGNOSIS — R630 Anorexia: Secondary | ICD-10-CM | POA: Diagnosis not present

## 2015-06-28 DIAGNOSIS — I499 Cardiac arrhythmia, unspecified: Secondary | ICD-10-CM | POA: Diagnosis not present

## 2015-06-28 DIAGNOSIS — Z86718 Personal history of other venous thrombosis and embolism: Secondary | ICD-10-CM | POA: Diagnosis not present

## 2015-06-28 DIAGNOSIS — Z79899 Other long term (current) drug therapy: Secondary | ICD-10-CM | POA: Insufficient documentation

## 2015-06-28 DIAGNOSIS — I4891 Unspecified atrial fibrillation: Secondary | ICD-10-CM | POA: Diagnosis not present

## 2015-06-28 DIAGNOSIS — H9190 Unspecified hearing loss, unspecified ear: Secondary | ICD-10-CM | POA: Diagnosis not present

## 2015-06-28 DIAGNOSIS — Z7982 Long term (current) use of aspirin: Secondary | ICD-10-CM | POA: Diagnosis not present

## 2015-06-28 DIAGNOSIS — I25119 Atherosclerotic heart disease of native coronary artery with unspecified angina pectoris: Secondary | ICD-10-CM | POA: Diagnosis not present

## 2015-06-28 DIAGNOSIS — I10 Essential (primary) hypertension: Secondary | ICD-10-CM | POA: Diagnosis not present

## 2015-06-28 DIAGNOSIS — R5383 Other fatigue: Secondary | ICD-10-CM | POA: Insufficient documentation

## 2015-06-28 DIAGNOSIS — I252 Old myocardial infarction: Secondary | ICD-10-CM | POA: Diagnosis not present

## 2015-06-28 LAB — CBC
HEMATOCRIT: 39.7 % (ref 36.0–46.0)
Hemoglobin: 12.7 g/dL (ref 12.0–15.0)
MCH: 27.7 pg (ref 26.0–34.0)
MCHC: 32 g/dL (ref 30.0–36.0)
MCV: 86.7 fL (ref 78.0–100.0)
PLATELETS: 196 10*3/uL (ref 150–400)
RBC: 4.58 MIL/uL (ref 3.87–5.11)
RDW: 15.4 % (ref 11.5–15.5)
WBC: 8.4 10*3/uL (ref 4.0–10.5)

## 2015-06-28 LAB — URINALYSIS, ROUTINE W REFLEX MICROSCOPIC
Glucose, UA: NEGATIVE mg/dL
HGB URINE DIPSTICK: NEGATIVE
KETONES UR: 15 mg/dL — AB
NITRITE: NEGATIVE
Protein, ur: 100 mg/dL — AB
Specific Gravity, Urine: 1.024 (ref 1.005–1.030)
UROBILINOGEN UA: 1 mg/dL (ref 0.0–1.0)
pH: 5 (ref 5.0–8.0)

## 2015-06-28 LAB — BASIC METABOLIC PANEL
Anion gap: 8 (ref 5–15)
BUN: 16 mg/dL (ref 6–20)
CALCIUM: 8.4 mg/dL — AB (ref 8.9–10.3)
CO2: 27 mmol/L (ref 22–32)
Chloride: 103 mmol/L (ref 101–111)
Creatinine, Ser: 1.23 mg/dL — ABNORMAL HIGH (ref 0.44–1.00)
GFR calc Af Amer: 42 mL/min — ABNORMAL LOW (ref >60)
GFR calc non Af Amer: 36 mL/min — ABNORMAL LOW (ref >60)
Glucose, Bld: 102 mg/dL — ABNORMAL HIGH (ref 65–99)
POTASSIUM: 4.5 mmol/L (ref 3.5–5.1)
Sodium: 138 mmol/L (ref 135–145)

## 2015-06-28 LAB — I-STAT TROPONIN, ED: Troponin i, poc: 0 ng/mL (ref 0.00–0.08)

## 2015-06-28 LAB — URINE MICROSCOPIC-ADD ON

## 2015-06-28 LAB — BRAIN NATRIURETIC PEPTIDE: B Natriuretic Peptide: 875.8 pg/mL — ABNORMAL HIGH (ref 0.0–100.0)

## 2015-06-28 NOTE — ED Notes (Signed)
EMS-pt is from [facility] facility, staff states she has been there for a week. States today patient with poor po intake, tired, and patient noted to be in bradycardia. 18g(L)AC. HR noted to be in the 40s en route. BP 110 systolic.

## 2015-06-28 NOTE — ED Notes (Signed)
Pt eating bread and applesauce.

## 2015-06-28 NOTE — ED Provider Notes (Signed)
CSN: [id number]     Arrival date & time 06/28/15  1537 History   First MD Initiated Contact with Patient 06/28/15 1543     Chief Complaint  Patient presents with  . Fatigue  . Bradycardia     (Consider location/radiation/quality/duration/timing/severity/associated sxs/prior Treatment) Patient is a 81 y.o. female presenting with altered mental status.  Altered Mental Status Presenting symptoms: lethargy   Severity:  Moderate Most recent episode:  Today Episode history:  Single Duration:  1 day Timing:  Constant Progression:  Unchanged Chronicity:  New Context: nursing home resident   Context comment:  Recently moved back to  from [place] Associated symptoms: decreased appetite (has not wanted to eat much today.)   Associated symptoms: no abdominal pain, no fever, no nausea and no vomiting     Past Medical History  Diagnosis Date  . Arthritis   . Angina   . Coronary artery disease   . Hypertension   . Myocardial infarction   . Dysrhythmia   . DVT (deep venous thrombosis)   . Subdural hematoma   . Glaucoma   . Hearing loss   . Atrial fibrillation    Past Surgical History  Procedure Laterality Date  . Subdural hematoma evacuation via craniotomy     History reviewed. No pertinent family history. History  Substance Use Topics  . Smoking status: Never Smoker   . Smokeless tobacco: Not on file  . Alcohol Use: No   OB History    No data available     Review of Systems  Constitutional: Positive for decreased appetite (has not wanted to eat much today.). Negative for fever.  Gastrointestinal: Negative for nausea, vomiting and abdominal pain.  All other systems reviewed and are negative. Obtained from patient and son    Allergies  Review of patient's allergies indicates no known allergies.  Home Medications   Prior to Admission medications   Medication Sig Start Date End Date Taking? Authorizing Provider  acetaminophen (TYLENOL) 325 MG tablet Take 650  mg by mouth every 6 (six) hours as needed. For pain     Historical Provider, MD  acetaminophen (TYLENOL) 325 MG tablet Take 650 mg by mouth 2 (two) times daily.      Historical Provider, MD  amLODipine (NORVASC) 5 MG tablet Take 5 mg by mouth daily.      Historical Provider, MD  aspirin 325 MG tablet Take 325 mg by mouth daily.      Historical Provider, MD  bimatoprost (LUMIGAN) 0.01 % SOLN Place 1 drop into both eyes at bedtime.      Historical Provider, MD  Calcium Carbonate (CALTRATE 600 PO) Take 1 tablet by mouth daily.      Historical Provider, MD  carvedilol (COREG) 12.5 MG tablet Take 12.5 mg by mouth 2 (two) times daily with a meal.      Historical Provider, MD  cholecalciferol (VITAMIN D) 1000 UNITS tablet Take 1,000 Units by mouth daily.      Historical Provider, MD  divalproex (DEPAKOTE) 250 MG DR tablet Take 250 mg by mouth every other day.      Historical Provider, MD  furosemide (LASIX) 20 MG tablet Take 2 tablets (40 mg total) by mouth daily. 11/27/11 11/26/12  Martha Clan, MD  HYDROcodone-acetaminophen (VICODIN) 5-500 MG per tablet Take 1 tablet by mouth 2 (two) times daily. Hold for sedation or confusion    Historical Provider, MD  latanoprost (XALATAN) 0.005 % ophthalmic solution Place 1 drop into both eyes at bedtime.  Historical Provider, MD  Nutritional Supplements (ENSURE PO) Take 1 each by mouth 2 (two) times daily. Drink one can twice a day     Historical Provider, MD  potassium chloride SA (K-DUR,KLOR-CON) 20 MEQ tablet Take 20 mEq by mouth daily.      Historical Provider, MD  simvastatin (ZOCOR) 20 MG tablet Take 20 mg by mouth at bedtime.      Historical Provider, MD  Wheat Dextrin (BENEFIBER DRINK MIX PO) Take 1 application by mouth once a week. Mix 1 tablespoon with 18 oz. Of water     Historical Provider, MD   BP 115/51 mmHg  Pulse 41  Temp(Src) 97.8 F (36.6 C) (Oral)  Resp 23  SpO2 98% Physical Exam  Constitutional: She appears well-developed and  well-nourished. No distress.  HENT:  Head: Normocephalic and atraumatic.  Mouth/Throat: Oropharynx is clear and moist.  Eyes: Conjunctivae are normal. Pupils are equal, round, and reactive to light. No scleral icterus.  Neck: Neck supple.  Cardiovascular: Normal heart sounds and intact distal pulses.  An irregularly irregular rhythm present. Bradycardia present.   No murmur heard. Pulmonary/Chest: Effort normal and breath sounds normal. No stridor. No respiratory distress. She has no rales.  Abdominal: Soft. Bowel sounds are normal. She exhibits no distension. There is no tenderness.  Musculoskeletal: Normal range of motion.  Neurological:  Somnolent but easily arousable.  Able to sit up in bed with assistance.    Skin: Skin is warm and dry. No rash noted.  Psychiatric: She has a normal mood and affect. Her behavior is normal.  Nursing note and vitals reviewed.   ED Course  Procedures (including critical care time) Labs Review Labs Reviewed  BASIC METABOLIC PANEL - Abnormal; Notable for the following:    Glucose, Bld 102 (*)    Creatinine, Ser 1.23 (*)    Calcium 8.4 (*)    GFR calc non Af Amer 36 (*)    GFR calc Af Amer 42 (*)    All other components within normal limits  URINALYSIS, ROUTINE W REFLEX MICROSCOPIC (NOT AT [facility]) - Abnormal; Notable for the following:    Color, Urine AMBER (*)    APPearance CLOUDY (*)    Bilirubin Urine SMALL (*)    Ketones, ur 15 (*)    Protein, ur 100 (*)    Leukocytes, UA TRACE (*)    All other components within normal limits  BRAIN NATRIURETIC PEPTIDE - Abnormal; Notable for the following:    B Natriuretic Peptide 875.8 (*)    All other components within normal limits  URINE MICROSCOPIC-ADD ON - Abnormal; Notable for the following:    Bacteria, UA FEW (*)    Casts HYALINE CASTS (*)    All other components within normal limits  CBC  I-STAT TROPOININ, ED    Imaging Review Dg Chest Portable 1 View  06/28/2015   CLINICAL DATA:  Altered  mental status.  Weakness.  EXAM: PORTABLE CHEST - 1 VIEW  COMPARISON:  Radiograph 11/26/2011  FINDINGS: Mild enlarged cardiac silhouette. Lungs are hyperinflated. There is biapical pulmonary scarring not changed prior. No pulmonary edema. No pneumothorax.  IMPRESSION: 1. No acute findings. 2. Cardiomegaly. 3. Hyperinflated lungs with biapical pulmonary scarring.   Electronically Signed   By: Genevive Bi M.D.   On: 06/28/2015 17:07     EKG Interpretation   Date/Time:  Friday June 28 2015 15:45:21 EDT Ventricular Rate:  38 PR Interval:    QRS Duration: 122 QT Interval:  552 QTC  Calculation: 439 R Axis:   -57 Text Interpretation:  Atrial fibrillation Left bundle branch block  compared to 20-Nov-2011, V rate significantly slower Confirmed by [facility]   MD, TREY ([id number]) on 06/28/2015 4:39:58 PM      MDM   Final diagnoses:  Other fatigue  Drowsiness    81 yo female presenting with being tired and with poor PO intake.  She was found to be bradycardic with a fib, of which she has a history.  She was not hypotensive.  She was not toxic and not ill appearing.  Afebrile.  Labs were checked, which were unremarkable.  After her workup was completed, she appeared much more awake and appropriate on re-exam.  She and her son question whether her symptoms could be secondary to her pain medication.  This seems plausible, as her MAR shows she has had more tramadol in the past two days. I discussed admission vs discharge back to nursing facility with her son.  Her son preferred to take her back home.  She will follow up closely with her PCP.  Her son will make sure they hold her tramadol.     Blake Divine, MD 06/28/15 2049

## 2015-06-28 NOTE — ED Notes (Signed)
Attempted report to nursing facility.

## 2015-06-28 NOTE — ED Notes (Signed)
MD at bedside. 

## 2015-06-28 NOTE — Discharge Instructions (Signed)

## 2015-09-02 ENCOUNTER — Emergency Department (HOSPITAL_COMMUNITY)
Admission: EM | Admit: 2015-09-02 | Discharge: 2015-09-02 | Disposition: A | Discharge status: Skilled Nursing Facility | Payer: Medicare Other | Attending: Emergency Medicine | Admitting: Emergency Medicine

## 2015-09-02 ENCOUNTER — Emergency Department (HOSPITAL_COMMUNITY): Payer: Medicare Other

## 2015-09-02 ENCOUNTER — Encounter (HOSPITAL_COMMUNITY): Payer: Self-pay | Admitting: *Deleted

## 2015-09-02 DIAGNOSIS — Y9389 Activity, other specified: Secondary | ICD-10-CM | POA: Insufficient documentation

## 2015-09-02 DIAGNOSIS — S79912A Unspecified injury of left hip, initial encounter: Secondary | ICD-10-CM | POA: Insufficient documentation

## 2015-09-02 DIAGNOSIS — I2510 Atherosclerotic heart disease of native coronary artery without angina pectoris: Secondary | ICD-10-CM | POA: Insufficient documentation

## 2015-09-02 DIAGNOSIS — H409 Unspecified glaucoma: Secondary | ICD-10-CM | POA: Insufficient documentation

## 2015-09-02 DIAGNOSIS — I252 Old myocardial infarction: Secondary | ICD-10-CM | POA: Diagnosis not present

## 2015-09-02 DIAGNOSIS — Y92129 Unspecified place in nursing home as the place of occurrence of the external cause: Secondary | ICD-10-CM | POA: Diagnosis not present

## 2015-09-02 DIAGNOSIS — Z7982 Long term (current) use of aspirin: Secondary | ICD-10-CM | POA: Insufficient documentation

## 2015-09-02 DIAGNOSIS — W06XXXA Fall from bed, initial encounter: Secondary | ICD-10-CM | POA: Diagnosis not present

## 2015-09-02 DIAGNOSIS — Z86718 Personal history of other venous thrombosis and embolism: Secondary | ICD-10-CM | POA: Insufficient documentation

## 2015-09-02 DIAGNOSIS — W19XXXA Unspecified fall, initial encounter: Secondary | ICD-10-CM

## 2015-09-02 DIAGNOSIS — I4891 Unspecified atrial fibrillation: Secondary | ICD-10-CM | POA: Diagnosis not present

## 2015-09-02 DIAGNOSIS — I10 Essential (primary) hypertension: Secondary | ICD-10-CM | POA: Diagnosis not present

## 2015-09-02 DIAGNOSIS — Y998 Other external cause status: Secondary | ICD-10-CM | POA: Diagnosis not present

## 2015-09-02 DIAGNOSIS — H9190 Unspecified hearing loss, unspecified ear: Secondary | ICD-10-CM | POA: Insufficient documentation

## 2015-09-02 DIAGNOSIS — M25552 Pain in left hip: Secondary | ICD-10-CM

## 2015-09-02 DIAGNOSIS — Z79899 Other long term (current) drug therapy: Secondary | ICD-10-CM | POA: Diagnosis not present

## 2015-09-02 DIAGNOSIS — R52 Pain, unspecified: Secondary | ICD-10-CM

## 2015-09-02 MED ORDER — HYDROCODONE-ACETAMINOPHEN 5-325 MG PO TABS
1.0000 | ORAL_TABLET | Freq: Once | ORAL | Status: AC
  Administered 2015-09-02: 1 via ORAL
  Filled 2015-09-02: qty 1

## 2015-09-02 MED ORDER — HYDROCODONE-ACETAMINOPHEN 5-325 MG PO TABS: 2.0000 | ORAL_TABLET | ORAL | Status: DC | PRN

## 2015-09-02 NOTE — ED Notes (Signed)
Per facility pt walks occasionally, possibly once a day, or sits in her recliner.   md at bedside

## 2015-09-02 NOTE — ED Notes (Signed)
Bed: [facility] Expected date:  Expected time:  Means of arrival:  Comments: EMS- 82yo F, fall/no complaints

## 2015-09-02 NOTE — ED Notes (Signed)
PA at bedside.

## 2015-09-02 NOTE — Discharge Instructions (Signed)
You were evaluated in the ED today after fall. The x-rays of your hip were negative for any acute broken bones or dislocations. The CT scan of her head was also negative for any new problems. Please follow-up with your doctors as needed for reevaluation. Please take your pain medicine as prescribed. It is important that she try not to move independently without assistance. Return to ED for any new or worsening symptoms.

## 2015-09-02 NOTE — ED Notes (Signed)
Per ems pt is from [facility] on [address], pt had unwitnessed fall out of bed, 3rd shift staff heard the fall, went into room, pt was sitting on floor next to her bed. Pt denies falling. Normally pt is ambulatory.  This happened around 0700, per first shift, and called ems. Pt hx chronic low back pain, reports low back pain. Alert and oriented per baseline. Pt reports left hip pain upon movement. No shortening or rotation noted.

## 2015-09-02 NOTE — ED Provider Notes (Signed)
CSN: [id number]     Arrival date & time 09/02/15  1147 History   First MD Initiated Contact with Patient 09/02/15 1205     No chief complaint on file.  Level V caveat: Dementia  (Consider location/radiation/quality/duration/timing/severity/associated sxs/prior Treatment) HPI [name] is a 81 y.o. female who comes in for evaluation after possible fall. History of present illness is relatively limited due to patient mental status. Patient is from [facility] facility where patient had an unwitnessed fall from bed. Per nursing report, third shift noted that they heard a sound coming from patient's room at approximately 7:00 AM, walked in and found patient sitting by her bed. They reported problem to first shift nurse and patient presents now via EMS for evaluation.  Past Medical History  Diagnosis Date  . Arthritis   . Angina   . Coronary artery disease   . Hypertension   . Myocardial infarction (HCC)   . Dysrhythmia   . DVT (deep venous thrombosis) (HCC)   . Subdural hematoma (HCC)   . Glaucoma   . Hearing loss   . Atrial fibrillation [facility])    Past Surgical History  Procedure Laterality Date  . Subdural hematoma evacuation via craniotomy     History reviewed. No pertinent family history. Social History  Substance Use Topics  . Smoking status: Never Smoker   . Smokeless tobacco: None  . Alcohol Use: No   OB History    No data available     Review of Systems A 10 point review of systems was completed and was negative except for pertinent positives and negatives as mentioned in the history of present illness     Allergies  Review of patient's allergies indicates no known allergies.  Home Medications   Prior to Admission medications   Medication Sig Start Date End Date Taking? Authorizing Provider  aspirin EC 81 MG tablet Take 81 mg by mouth daily.   Yes Historical Provider, MD  bimatoprost (LUMIGAN) 0.01 % SOLN Place 1 drop into both eyes at bedtime.     Yes  Historical Provider, MD  Calcium Carbonate-Vitamin D (CALCARB 600/D) 600-400 MG-UNIT per tablet Take 1 tablet by mouth 2 (two) times daily.   Yes Historical Provider, MD  carbamide peroxide (DEBROX) 6.5 % otic solution Place 5 drops into both ears 3 (three) times daily as needed (for affected areas).   Yes Historical Provider, MD  carvedilol (COREG) 25 MG tablet Take 25 mg by mouth 2 (two) times daily with a meal.   Yes Historical Provider, MD  Cholecalciferol (VITAMIN D PO) Take 800 Units by mouth daily.   Yes Historical Provider, MD  diltiazem (CARDIZEM CD) 180 MG 24 hr capsule Take 180 mg by mouth daily.   Yes Historical Provider, MD  divalproex (DEPAKOTE) 250 MG DR tablet Take 250 mg by mouth at bedtime.    Yes Historical Provider, MD  dorzolamide-timolol (COSOPT) 22.3-6.8 MG/ML ophthalmic solution Place 1 drop into both eyes 2 (two) times daily.   Yes Historical Provider, MD  ENSURE (ENSURE) Take 237 mLs by mouth daily.   Yes Historical Provider, MD  lisinopril-hydrochlorothiazide (PRINZIDE,ZESTORETIC) 20-12.5 MG per tablet Take 2 tablets by mouth every morning.   Yes Historical Provider, MD  mineral oil-hydrophilic petrolatum (AQUAPHOR) ointment Apply 1 application topically as needed for dry skin or irritation.   Yes Historical Provider, MD  mirtazapine (REMERON) 15 MG tablet Take 15 mg by mouth at bedtime.   Yes Historical Provider, MD  ranitidine (ZANTAC) 150  MG tablet Take 150 mg by mouth every morning.   Yes Historical Provider, MD  furosemide (LASIX) 20 MG tablet Take 2 tablets (40 mg total) by mouth daily. Patient not taking: Reported on 09/02/2015 11/27/11 06/28/15  Martha Clan, MD  HYDROcodone-acetaminophen [facility]) 5-325 MG tablet Take 2 tablets by mouth every 4 (four) hours as needed. 09/02/15   Elaysha Bevard, PA-C   BP 152/72 mmHg  Pulse 77  Temp(Src) 98.7 F (37.1 C) (Oral)  Resp 14  SpO2 96% Physical Exam  Constitutional: She is oriented to person, place, and time. She  appears well-developed and well-nourished.  HENT:  Head: Normocephalic and atraumatic.  Mouth/Throat: Oropharynx is clear and moist.  Eyes: Conjunctivae are normal. Pupils are equal, round, and reactive to light. Right eye exhibits no discharge. Left eye exhibits no discharge. No scleral icterus.  Neck: Neck supple.  Cardiovascular: Normal rate, regular rhythm and normal heart sounds.   Pulmonary/Chest: Effort normal and breath sounds normal. No respiratory distress. She has no wheezes. She has no rales.  Abdominal: Soft. There is no tenderness.  Musculoskeletal: She exhibits no tenderness.  Discomfort noted with range of motion of the left lower extremity specifically left hip. No obvious lesions or deformities. No crepitus. Distal pulses are intact.  Neurological: She is alert and oriented to person, place, and time.  Cranial Nerves II-XII grossly intact  Skin: Skin is warm and dry. No rash noted.  Psychiatric: She has a normal mood and affect.  Nursing note and vitals reviewed.   ED Course  Procedures (including critical care time) Labs Review Labs Reviewed - No data to display  Imaging Review Ct Head Wo Contrast  09/02/2015   CLINICAL DATA:  81 year old who had a possible unwitnessed fall out of bed at the nursing home earlier today, though the patient denies falling. Prior history of subdural hematoma.  EXAM: CT HEAD WITHOUT CONTRAST  CT CERVICAL SPINE WITHOUT CONTRAST  TECHNIQUE: Multidetector CT imaging of the head and cervical spine was performed following the standard protocol without intravenous contrast. Multiplanar CT image reconstructions of the cervical spine were also generated.  COMPARISON:  CT head and cervical spine 05/19/2011. CT head 09/17/2009 dating back to 07/13/2009.  FINDINGS: CT HEAD FINDINGS  Moderate to severe cortical, deep and cerebellar atrophy, not significantly changed dating back to 2010. Moderate changes of small vessel disease of the white matter  diffusely, unchanged. No mass lesion. No midline shift. No acute hemorrhage or hematoma. No extra-axial fluid collections. No evidence of acute infarction. Calcification involving the meninges of the right frontal lobe approaching the vertex, likely dystrophic in related to the prior hematoma.  Prior right frontal and right temporal burr hole placement. No skull fractures. Visualized paranasal sinuses, bilateral mastoid air cells and bilateral middle ear cavities well-aerated. Severe bilateral carotid siphon and vertebral artery atherosclerosis.  CT CERVICAL SPINE FINDINGS  No fractures identified involving the cervical spine. Generalized osseous demineralization. Soft tissue window images demonstrate multilevel disc protrusions including a central and left paracentral protrusion at C2-3 with calcification in the posterior annular fibers, a central disc protrusion at C3-4, a central disc extrusion at C4-5 with compression of the anterior cord, and a broad-based central disc protrusion at C5-6. The extrusion at C5-6 has progressed since the prior CT, while the other findings are unchanged. Facet joints intact throughout with mild degenerative changes. Exaggeration of the usual cervical lordosis, likely due to an exaggerated thoracic kyphosis. Craniocervical junction intact. C1-C2 articulation intact with severe degenerative changes. Calcified  pannus posterior to the dens. Lateral masses intact.  Pleuroparenchymal scarring involving both lung apices with associated pleural calcification.  IMPRESSION: 1. No acute intracranial abnormality. 2. Stable moderate to severe generalized atrophy and moderate chronic microvascular ischemic changes of the white matter. 3. No cervical spine fractures identified. 4. Multilevel cervical disk disease as detailed above, most significantly a central disc extrusion at C5-6 with associated mild cord compression.   Electronically Signed   By: Hulan Saas M.D.   On: 09/02/2015 14:45    Ct Cervical Spine Wo Contrast  09/02/2015   CLINICAL DATA:  82 year old who had a possible unwitnessed fall out of bed at the nursing home earlier today, though the patient denies falling. Prior history of subdural hematoma.  EXAM: CT HEAD WITHOUT CONTRAST  CT CERVICAL SPINE WITHOUT CONTRAST  TECHNIQUE: Multidetector CT imaging of the head and cervical spine was performed following the standard protocol without intravenous contrast. Multiplanar CT image reconstructions of the cervical spine were also generated.  COMPARISON:  CT head and cervical spine 05/19/2011. CT head 09/17/2009 dating back to 07/13/2009.  FINDINGS: CT HEAD FINDINGS  Moderate to severe cortical, deep and cerebellar atrophy, not significantly changed dating back to 2010. Moderate changes of small vessel disease of the white matter diffusely, unchanged. No mass lesion. No midline shift. No acute hemorrhage or hematoma. No extra-axial fluid collections. No evidence of acute infarction. Calcification involving the meninges of the right frontal lobe approaching the vertex, likely dystrophic in related to the prior hematoma.  Prior right frontal and right temporal burr hole placement. No skull fractures. Visualized paranasal sinuses, bilateral mastoid air cells and bilateral middle ear cavities well-aerated. Severe bilateral carotid siphon and vertebral artery atherosclerosis.  CT CERVICAL SPINE FINDINGS  No fractures identified involving the cervical spine. Generalized osseous demineralization. Soft tissue window images demonstrate multilevel disc protrusions including a central and left paracentral protrusion at C2-3 with calcification in the posterior annular fibers, a central disc protrusion at C3-4, a central disc extrusion at C4-5 with compression of the anterior cord, and a broad-based central disc protrusion at C5-6. The extrusion at C5-6 has progressed since the prior CT, while the other findings are unchanged. Facet joints intact  throughout with mild degenerative changes. Exaggeration of the usual cervical lordosis, likely due to an exaggerated thoracic kyphosis. Craniocervical junction intact. C1-C2 articulation intact with severe degenerative changes. Calcified pannus posterior to the dens. Lateral masses intact.  Pleuroparenchymal scarring involving both lung apices with associated pleural calcification.  IMPRESSION: 1. No acute intracranial abnormality. 2. Stable moderate to severe generalized atrophy and moderate chronic microvascular ischemic changes of the white matter. 3. No cervical spine fractures identified. 4. Multilevel cervical disk disease as detailed above, most significantly a central disc extrusion at C5-6 with associated mild cord compression.   Electronically Signed   By: Hulan Saas M.D.   On: 09/02/2015 14:45   Dg Hip Unilat With Pelvis 2-3 Views Left  09/02/2015   CLINICAL DATA:  Left hip pain following a fall.  EXAM: DG HIP (WITH OR WITHOUT PELVIS) 2-3V LEFT  COMPARISON:  10/30/2012.  FINDINGS: Severe collapse of the left femoral head with progression. There is some residual heterogeneous sclerosis in the remaining portion of the femoral head, extending into the proximal neck. 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 No fracture or dislocation seen. Diffuse osteopenia. Arterial calcifications. Inferior vena cava filter.  IMPRESSION: 1. No fracture or dislocation. 2. 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 3. Diffuse osteopenia.   Electronically Signed   By: Beckie Salts M.D.   On: 09/02/2015 13:10   Dg Femur Min 2 Views Left  09/02/2015   CLINICAL DATA:  Fall.  Hip pain.  EXAM: LEFT FEMUR 2 VIEWS  COMPARISON:  No prior.  FINDINGS: Peripheral vascular calcification. Chronic deformity left hip is noted with severe flattening of what remains of the femoral head and protrusio acetabula. No evidence  of erosive arthropathy no acute fracture. Diffuse severe osteopenia. Peripheral vascular calcification.  IMPRESSION: Chronic changes as above.  No acute abnormality .   Electronically Signed   By: Maisie Fus  Register   On: 09/02/2015 13:35   I have personally reviewed and evaluated these images and lab results as part of my medical decision-making.   EKG Interpretation None     Meds given in ED:  Medications  HYDROcodone-acetaminophen (NORCO/VICODIN) 5-325 MG per tablet 1 tablet (1 tablet Oral Given 09/02/15 1641)    New Prescriptions   HYDROCODONE-ACETAMINOPHEN (NORCO) 5-325 MG TABLET    Take 2 tablets by mouth every 4 (four) hours as needed.   Filed Vitals:   09/02/15 1151 09/02/15 1704  BP: 120/62 152/72  Pulse: 85 77  Temp: 98.1 F (36.7 C) 98.7 F (37.1 C)  TempSrc: Oral Oral  Resp: 14 14  SpO2: 98% 96%    MDM  Vitals stable - WNL -afebrile Pt resting comfortably in ED. Patient's son is at bedside. Reports that patient has not been ambulatory for some time. States that she will only ambulate "minimally, from her wheelchair to the toilet, but does not walk independently". States that her current pain has been present for some time. There are no new objective findings as related to her fall from earlier today. Also discussed with patient's living facility and they state that patient is at baseline mental status. PE--physical exam as above. Distal pulses are intact. Discomfort to left hip is baseline with no new findings. Muscle compartments are soft. 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 No acute findings. CT head is negative for any acute intracranial abnormalities. CT cervical spine also shows no fractures or other acute findings.  At this time, family opts not to have further investigatory imaging performed. Prefers to go home with pain control. Patient given Norco in the ED and discharged with short course of the  same. I discussed all relevant lab findings and imaging results with pt and they verbalized understanding. Discussed f/u with PCP within 48 hrs and return precautions, pt very amenable to plan. Prior to patient discharge, I discussed and reviewed this case with Dr.Nguyen   Final diagnoses:  Left hip pain  Fall, initial encounter        Joycie Peek, PA-C 09/02/15 1745  Leta Baptist, MD 09/02/15 2135

## 2015-09-02 NOTE — ED Notes (Signed)
[facility] at bedside to transport patient 

## 2015-09-02 NOTE — ED Notes (Signed)
Patient transported to X-ray 

## 2015-09-02 NOTE — ED Notes (Signed)
Pt called and spoke with nurse from facility, pt is soft spoken and does not speak much, reports pt livens up when son is around. Pt does have "bone on bone hip pain". Staff states pt has early onset dementia, and what rn at ED described is at baseline.

## 2015-09-02 NOTE — ED Notes (Addendum)
Family Armed forces operational officer of facility, director states pt can pivot but is not "ambulatory".   Gave discharge report to director.   [facility] called

## 2015-09-06 ENCOUNTER — Emergency Department (HOSPITAL_COMMUNITY): Payer: Medicare Other

## 2015-09-06 ENCOUNTER — Encounter (HOSPITAL_COMMUNITY): Payer: Self-pay | Admitting: Emergency Medicine

## 2015-09-06 ENCOUNTER — Inpatient Hospital Stay (HOSPITAL_COMMUNITY)
Admission: EM | Admit: 2015-09-06 | Discharge: 2015-09-10 | DRG: 534 | Disposition: A | Discharge status: Hospice | Payer: Medicare Other | Attending: Family Medicine | Admitting: Family Medicine

## 2015-09-06 DIAGNOSIS — Z86718 Personal history of other venous thrombosis and embolism: Secondary | ICD-10-CM | POA: Diagnosis not present

## 2015-09-06 DIAGNOSIS — Y92129 Unspecified place in nursing home as the place of occurrence of the external cause: Secondary | ICD-10-CM | POA: Diagnosis not present

## 2015-09-06 DIAGNOSIS — H409 Unspecified glaucoma: Secondary | ICD-10-CM | POA: Diagnosis present

## 2015-09-06 DIAGNOSIS — Z66 Do not resuscitate: Secondary | ICD-10-CM | POA: Diagnosis present

## 2015-09-06 DIAGNOSIS — Z79899 Other long term (current) drug therapy: Secondary | ICD-10-CM

## 2015-09-06 DIAGNOSIS — Z7401 Bed confinement status: Secondary | ICD-10-CM | POA: Diagnosis not present

## 2015-09-06 DIAGNOSIS — M25552 Pain in left hip: Secondary | ICD-10-CM | POA: Diagnosis present

## 2015-09-06 DIAGNOSIS — I480 Paroxysmal atrial fibrillation: Secondary | ICD-10-CM | POA: Diagnosis not present

## 2015-09-06 DIAGNOSIS — F0281 Dementia in other diseases classified elsewhere with behavioral disturbance: Secondary | ICD-10-CM | POA: Diagnosis present

## 2015-09-06 DIAGNOSIS — I2510 Atherosclerotic heart disease of native coronary artery without angina pectoris: Secondary | ICD-10-CM | POA: Diagnosis present

## 2015-09-06 DIAGNOSIS — I4891 Unspecified atrial fibrillation: Secondary | ICD-10-CM | POA: Diagnosis present

## 2015-09-06 DIAGNOSIS — Z79891 Long term (current) use of opiate analgesic: Secondary | ICD-10-CM

## 2015-09-06 DIAGNOSIS — R296 Repeated falls: Secondary | ICD-10-CM | POA: Diagnosis present

## 2015-09-06 DIAGNOSIS — M1990 Unspecified osteoarthritis, unspecified site: Secondary | ICD-10-CM | POA: Diagnosis present

## 2015-09-06 DIAGNOSIS — H9190 Unspecified hearing loss, unspecified ear: Secondary | ICD-10-CM | POA: Diagnosis present

## 2015-09-06 DIAGNOSIS — Z515 Encounter for palliative care: Secondary | ICD-10-CM | POA: Diagnosis not present

## 2015-09-06 DIAGNOSIS — S72402A Unspecified fracture of lower end of left femur, initial encounter for closed fracture: Secondary | ICD-10-CM | POA: Diagnosis present

## 2015-09-06 DIAGNOSIS — Z7982 Long term (current) use of aspirin: Secondary | ICD-10-CM

## 2015-09-06 DIAGNOSIS — I10 Essential (primary) hypertension: Secondary | ICD-10-CM | POA: Diagnosis present

## 2015-09-06 DIAGNOSIS — G3183 Dementia with Lewy bodies: Secondary | ICD-10-CM | POA: Diagnosis not present

## 2015-09-06 DIAGNOSIS — R4702 Dysphasia: Secondary | ICD-10-CM | POA: Diagnosis present

## 2015-09-06 DIAGNOSIS — W06XXXA Fall from bed, initial encounter: Secondary | ICD-10-CM | POA: Diagnosis present

## 2015-09-06 DIAGNOSIS — S72492A Other fracture of lower end of left femur, initial encounter for closed fracture: Principal | ICD-10-CM | POA: Diagnosis present

## 2015-09-06 DIAGNOSIS — I252 Old myocardial infarction: Secondary | ICD-10-CM

## 2015-09-06 DIAGNOSIS — R1310 Dysphagia, unspecified: Secondary | ICD-10-CM | POA: Diagnosis present

## 2015-09-06 DIAGNOSIS — Z8679 Personal history of other diseases of the circulatory system: Secondary | ICD-10-CM

## 2015-09-06 LAB — CBC WITH DIFFERENTIAL/PLATELET
BASOS ABS: 0 10*3/uL (ref 0.0–0.1)
Basophils Relative: 0 %
Eosinophils Absolute: 0 10*3/uL (ref 0.0–0.7)
Eosinophils Relative: 0 %
HEMATOCRIT: 37.7 % (ref 36.0–46.0)
Hemoglobin: 12.5 g/dL (ref 12.0–15.0)
LYMPHS PCT: 5 %
Lymphs Abs: 0.7 10*3/uL (ref 0.7–4.0)
MCH: 28.1 pg (ref 26.0–34.0)
MCHC: 33.2 g/dL (ref 30.0–36.0)
MCV: 84.7 fL (ref 78.0–100.0)
Monocytes Absolute: 1.4 10*3/uL — ABNORMAL HIGH (ref 0.1–1.0)
Monocytes Relative: 11 %
NEUTROS ABS: 10.5 10*3/uL — AB (ref 1.7–7.7)
Neutrophils Relative %: 84 %
PLATELETS: 197 10*3/uL (ref 150–400)
RBC: 4.45 MIL/uL (ref 3.87–5.11)
RDW: 14.8 % (ref 11.5–15.5)
WBC: 12.6 10*3/uL — AB (ref 4.0–10.5)

## 2015-09-06 LAB — BASIC METABOLIC PANEL
ANION GAP: 9 (ref 5–15)
BUN: 35 mg/dL — ABNORMAL HIGH (ref 6–20)
CO2: 27 mmol/L (ref 22–32)
Calcium: 8.6 mg/dL — ABNORMAL LOW (ref 8.9–10.3)
Chloride: 95 mmol/L — ABNORMAL LOW (ref 101–111)
Creatinine, Ser: 0.94 mg/dL (ref 0.44–1.00)
GFR, EST AFRICAN AMERICAN: 57 mL/min — AB (ref >60)
GFR, EST NON AFRICAN AMERICAN: 50 mL/min — AB (ref >60)
GLUCOSE: 124 mg/dL — AB (ref 65–99)
POTASSIUM: 4.6 mmol/L (ref 3.5–5.1)
Sodium: 131 mmol/L — ABNORMAL LOW (ref 135–145)

## 2015-09-06 LAB — URINALYSIS, ROUTINE W REFLEX MICROSCOPIC
Bilirubin Urine: NEGATIVE
Glucose, UA: NEGATIVE mg/dL
Hgb urine dipstick: NEGATIVE
Ketones, ur: NEGATIVE mg/dL
Leukocytes, UA: NEGATIVE
NITRITE: NEGATIVE
PROTEIN: NEGATIVE mg/dL
Specific Gravity, Urine: 1.011 (ref 1.005–1.030)
UROBILINOGEN UA: 4 mg/dL — AB (ref 0.0–1.0)
pH: 6.5 (ref 5.0–8.0)

## 2015-09-06 LAB — PROTIME-INR
INR: 1.08 (ref 0.00–1.49)
Prothrombin Time: 14.2 seconds (ref 11.6–15.2)

## 2015-09-06 MED ORDER — ACETAMINOPHEN 325 MG PO TABS: 650.0000 mg | ORAL_TABLET | Freq: Four times a day (QID) | ORAL | Status: DC | PRN

## 2015-09-06 MED ORDER — FENTANYL CITRATE (PF) 100 MCG/2ML IJ SOLN
50.0000 ug | INTRAMUSCULAR | Status: DC | PRN
  Administered 2015-09-06 – 2015-09-08 (×6): 50 ug via INTRAVENOUS
  Filled 2015-09-06 (×6): qty 2

## 2015-09-06 MED ORDER — ONDANSETRON HCL 4 MG PO TABS: 4.0000 mg | ORAL_TABLET | Freq: Four times a day (QID) | ORAL | Status: DC | PRN

## 2015-09-06 MED ORDER — FENTANYL CITRATE (PF) 100 MCG/2ML IJ SOLN
25.0000 ug | Freq: Once | INTRAMUSCULAR | Status: DC
  Filled 2015-09-06 (×2): qty 2

## 2015-09-06 MED ORDER — ENOXAPARIN SODIUM 30 MG/0.3ML ~~LOC~~ SOLN: 30.0000 mg | Freq: Every day | SUBCUTANEOUS | Status: DC

## 2015-09-06 MED ORDER — ENOXAPARIN SODIUM 30 MG/0.3ML ~~LOC~~ SOLN
30.0000 mg | Freq: Every day | SUBCUTANEOUS | Status: DC
  Administered 2015-09-07 – 2015-09-10 (×4): 30 mg via SUBCUTANEOUS
  Filled 2015-09-06 (×4): qty 0.3

## 2015-09-06 MED ORDER — SODIUM CHLORIDE 0.9 % IV SOLN
INTRAVENOUS | Status: DC
  Administered 2015-09-06 – 2015-09-10 (×5): via INTRAVENOUS

## 2015-09-06 MED ORDER — FENTANYL CITRATE (PF) 100 MCG/2ML IJ SOLN
25.0000 ug | Freq: Once | INTRAMUSCULAR | Status: AC
  Administered 2015-09-06: 25 ug via INTRAVENOUS

## 2015-09-06 MED ORDER — ACETAMINOPHEN 650 MG RE SUPP: 650.0000 mg | Freq: Four times a day (QID) | RECTAL | Status: DC | PRN

## 2015-09-06 MED ORDER — FENTANYL CITRATE (PF) 100 MCG/2ML IJ SOLN
25.0000 ug | Freq: Once | INTRAMUSCULAR | Status: AC
  Administered 2015-09-06: 25 ug via INTRAVENOUS
  Filled 2015-09-06: qty 2

## 2015-09-06 MED ORDER — SODIUM CHLORIDE 0.9 % IV BOLUS (SEPSIS)
1000.0000 mL | Freq: Once | INTRAVENOUS | Status: AC
  Administered 2015-09-06: 1000 mL via INTRAVENOUS

## 2015-09-06 MED ORDER — LORAZEPAM 2 MG/ML IJ SOLN
0.5000 mg | Freq: Once | INTRAMUSCULAR | Status: AC
  Administered 2015-09-06: 0.5 mg via INTRAVENOUS
  Filled 2015-09-06: qty 1

## 2015-09-06 MED ORDER — FENTANYL CITRATE (PF) 100 MCG/2ML IJ SOLN
25.0000 ug | INTRAMUSCULAR | Status: DC | PRN
  Administered 2015-09-06: 25 ug via INTRAVENOUS
  Filled 2015-09-06: qty 2

## 2015-09-06 MED ORDER — SODIUM CHLORIDE 0.9 % IV SOLN
Freq: Once | INTRAVENOUS | Status: AC
  Administered 2015-09-06: 100 mL/h via INTRAVENOUS

## 2015-09-06 MED ORDER — HYDROCODONE-ACETAMINOPHEN 5-325 MG PO TABS
1.0000 | ORAL_TABLET | Freq: Once | ORAL | Status: DC
  Filled 2015-09-06: qty 1

## 2015-09-06 MED ORDER — LATANOPROST 0.005 % OP SOLN
1.0000 [drp] | Freq: Every day | OPHTHALMIC | Status: DC
  Administered 2015-09-07 – 2015-09-08 (×2): 1 [drp] via OPHTHALMIC
  Filled 2015-09-06 (×2): qty 2.5

## 2015-09-06 MED ORDER — FENTANYL CITRATE (PF) 100 MCG/2ML IJ SOLN
50.0000 ug | Freq: Once | INTRAMUSCULAR | Status: AC
Start: 2015-09-06 — End: 2015-09-07
  Administered 2015-09-07: 50 ug via INTRAVENOUS
  Filled 2015-09-06: qty 2

## 2015-09-06 MED ORDER — SODIUM CHLORIDE 0.9 % IJ SOLN
3.0000 mL | Freq: Two times a day (BID) | INTRAMUSCULAR | Status: DC
Start: 2015-09-06 — End: 2015-09-10
  Administered 2015-09-07 – 2015-09-10 (×3): 3 mL via INTRAVENOUS

## 2015-09-06 MED ORDER — HYDROMORPHONE HCL 1 MG/ML IJ SOLN
0.5000 mg | INTRAMUSCULAR | Status: DC | PRN
  Administered 2015-09-07: 0.5 mg via INTRAVENOUS
  Administered 2015-09-07 (×3): 1 mg via INTRAVENOUS
  Administered 2015-09-07: 0.5 mg via INTRAVENOUS
  Administered 2015-09-07 – 2015-09-10 (×11): 1 mg via INTRAVENOUS
  Filled 2015-09-06 (×17): qty 1

## 2015-09-06 MED ORDER — ONDANSETRON HCL 4 MG/2ML IJ SOLN: 4.0000 mg | Freq: Four times a day (QID) | INTRAMUSCULAR | Status: DC | PRN

## 2015-09-06 NOTE — ED Notes (Signed)
Spoke with pt's nurse at [facility].  Informed that since pt has to keep knee immobilizer on and can't bear weight on left leg that she may require more skilled care than what can be provided at her current facility.  Gave RN Lemont Fillers PA's direct number and to contact her once she found out what the facility decided once they got a hold of pt's son.   Made Tatyana aware.

## 2015-09-06 NOTE — Progress Notes (Signed)
CSW met with patient and son at bedside. Son confirms that he is interested in SNF.   Son confirms that the patient presents to [facility] due to fall. He states that this is the first incident that the patient has fallen within 6 months. Son states that the family has a great support system, which consist of him and his sister.  CSW colleague faxed patient out to SNF's within the county. However, there are no bed offers at this time. CSW spoke with [facility] admissions/ Gomez Cleverly, who states that their facility will also not be able to offer pt a bed at this time due to insurance payor source..  CSW spoke with son Jiles Prows, and also sister Silva Bandy who is on the phone and informed them that no SNF bed offers have been made at this time, and that patient is welcomed to come back to [facility] facility. CSW informed family that she spoke with [place]( Nurse at [facility]), who states that the patient is welcomed to come back, but they may have to sign an agreement form at the facility. Son expressed understanding.   Marguarite Arbour says that the facility has PT, but the family would have to pay for it. CSW also asked would their facility be able to provide a sitter. However, nurse states that the family would have to find their own private duty nurse/Sitter. CSW made son aware.   CSW educated family about medicare/ medicaid guidelines. CSW educated son and daughter about the differences between observation and medical admission. CSW also talked with family about private pay. CSW spoke with Nurse CM about patient. Nurse CM offered gave son a list of private duty nursing agencies. CSW will give son a list of SNF.  Son states that he does not have any questions at this time.  Son/Tyrone [phone]  Willette Brace [phone] ED CSW 09/06/2015 5:29 PM

## 2015-09-06 NOTE — ED Notes (Signed)
Bed: RU04 Expected date:  Expected time:  Means of arrival:  Comments: Larey Seat OOB

## 2015-09-06 NOTE — ED Notes (Signed)
Ortho tech at bedside 

## 2015-09-06 NOTE — ED Notes (Signed)
Per EMS pt comes from [facility] [address] for left hip pain after falling out of bed this morning.  Per facility staff, they reported to EMS that pt fell out of bed and they put her back into bed.  When getting pt up and ready for breakfast noticed that pt was unable to bear weight on left leg. EMS states there is shortening to left leg.

## 2015-09-06 NOTE — ED Provider Notes (Signed)
Care assumed from [place], [place].    [name] is a 81 y.o. female presents after fall at ALF with distal femur fracture on x-ray.  Pt's ALF refuses for her to return as even though she is wheelchair bound at baseline, she is an assisted transfer.  Social work was contacted to assist.  Previous provider spoke with Dr. Eulah Pont of orthopedics who advised to place patient in a knee immobilizer and follow-up in the office next week.  No surgical management at this time.   Physical Exam  BP 143/83 mmHg  Pulse 121  Temp(Src) 97.6 F (36.4 C) (Oral)  Resp 30  SpO2 87%  Physical Exam   Face to face Exam:   General: Awake  HEENT: Atraumatic  Resp: Normal effort  Cardiac: tachycardia Abd: Nondistended  Neuro:No focal weakness  Lymph: No adenopathy MSK:  Tenderness to palpation of the left knee   Results for orders placed or performed during the hospital encounter of 09/06/15  Urinalysis, Routine w reflex microscopic (not at [facility])  Result Value Ref Range   Color, Urine YELLOW YELLOW   APPearance CLEAR CLEAR   Specific Gravity, Urine 1.011 1.005 - 1.030   pH 6.5 5.0 - 8.0   Glucose, UA NEGATIVE NEGATIVE mg/dL   Hgb urine dipstick NEGATIVE NEGATIVE   Bilirubin Urine NEGATIVE NEGATIVE   Ketones, ur NEGATIVE NEGATIVE mg/dL   Protein, ur NEGATIVE NEGATIVE mg/dL   Urobilinogen, UA 4.0 (H) 0.0 - 1.0 mg/dL   Nitrite NEGATIVE NEGATIVE   Leukocytes, UA NEGATIVE NEGATIVE  CBC with Differential  Result Value Ref Range   WBC 12.6 (H) 4.0 - 10.5 K/uL   RBC 4.45 3.87 - 5.11 MIL/uL   Hemoglobin 12.5 12.0 - 15.0 g/dL   HCT 16.1 09.6 - 04.5 %   MCV 84.7 78.0 - 100.0 fL   MCH 28.1 26.0 - 34.0 pg   MCHC 33.2 30.0 - 36.0 g/dL   RDW 40.9 81.1 - 91.4 %   Platelets 197 150 - 400 K/uL   Neutrophils Relative % 84 %   Neutro Abs 10.5 (H) 1.7 - 7.7 K/uL   Lymphocytes Relative 5 %   Lymphs Abs 0.7 0.7 - 4.0 K/uL   Monocytes Relative 11 %   Monocytes Absolute 1.4 (H) 0.1 - 1.0 K/uL    Eosinophils Relative 0 %   Eosinophils Absolute 0.0 0.0 - 0.7 K/uL   Basophils Relative 0 %   Basophils Absolute 0.0 0.0 - 0.1 K/uL  Basic metabolic panel  Result Value Ref Range   Sodium 131 (L) 135 - 145 mmol/L   Potassium 4.6 3.5 - 5.1 mmol/L   Chloride 95 (L) 101 - 111 mmol/L   CO2 27 22 - 32 mmol/L   Glucose, Bld 124 (H) 65 - 99 mg/dL   BUN 35 (H) 6 - 20 mg/dL   Creatinine, Ser 7.82 0.44 - 1.00 mg/dL   Calcium 8.6 (L) 8.9 - 10.3 mg/dL   GFR calc non Af Amer 50 (L) >60 mL/min   GFR calc Af Amer 57 (L) >60 mL/min   Anion gap 9 5 - 15  Protime-INR  Result Value Ref Range   Prothrombin Time 14.2 11.6 - 15.2 seconds   INR 1.08 0.00 - 1.49   Ct Head Wo Contrast  09/06/2015   CLINICAL DATA:  Recent fall from bed with headaches and neck pain  EXAM: CT HEAD WITHOUT CONTRAST  CT CERVICAL SPINE WITHOUT CONTRAST  TECHNIQUE: Multidetector CT imaging of the head and  cervical spine was performed following the standard protocol without intravenous contrast. Multiplanar CT image reconstructions of the cervical spine were also generated.  COMPARISON:  09/02/2015  FINDINGS: CT HEAD FINDINGS  The bony calvarium is intact. No gross soft tissue abnormality is seen. Changes of prior burr hole placement on the right are again noted. Atrophic changes are noted. No findings to suggest acute hemorrhage, acute infarction or space-occupying mass lesion are noted.  CT CERVICAL SPINE FINDINGS  Scarring is noted in the apices bilaterally. Seven cervical segments are well visualized. Vertebral body height is well maintained. Degenerative changes of the facet joints are noted. No acute fracture is seen. The surrounding soft tissue structures show evidence of multilevel disc bulging from C2-C5. No definitive cord impingement is seen.  IMPRESSION: CT of the head: Atrophic changes without acute abnormality.  CT of the cervical spine: Multilevel degenerative change as described. No acute abnormality noted.   Electronically  Signed   By: Alcide Clever M.D.   On: 09/06/2015 10:10   Ct Head Wo Contrast  09/02/2015   CLINICAL DATA:  81 year old who had a possible unwitnessed fall out of bed at the nursing home earlier today, though the patient denies falling. Prior history of subdural hematoma.  EXAM: CT HEAD WITHOUT CONTRAST  CT CERVICAL SPINE WITHOUT CONTRAST  TECHNIQUE: Multidetector CT imaging of the head and cervical spine was performed following the standard protocol without intravenous contrast. Multiplanar CT image reconstructions of the cervical spine were also generated.  COMPARISON:  CT head and cervical spine 05/19/2011. CT head 09/17/2009 dating back to 07/13/2009.  FINDINGS: CT HEAD FINDINGS  Moderate to severe cortical, deep and cerebellar atrophy, not significantly changed dating back to 2010. Moderate changes of small vessel disease of the white matter diffusely, unchanged. No mass lesion. No midline shift. No acute hemorrhage or hematoma. No extra-axial fluid collections. No evidence of acute infarction. Calcification involving the meninges of the right frontal lobe approaching the vertex, likely dystrophic in related to the prior hematoma.  Prior right frontal and right temporal burr hole placement. No skull fractures. Visualized paranasal sinuses, bilateral mastoid air cells and bilateral middle ear cavities well-aerated. Severe bilateral carotid siphon and vertebral artery atherosclerosis.  CT CERVICAL SPINE FINDINGS  No fractures identified involving the cervical spine. Generalized osseous demineralization. Soft tissue window images demonstrate multilevel disc protrusions including a central and left paracentral protrusion at C2-3 with calcification in the posterior annular fibers, a central disc protrusion at C3-4, a central disc extrusion at C4-5 with compression of the anterior cord, and a broad-based central disc protrusion at C5-6. The extrusion at C5-6 has progressed since the prior CT, while the other  findings are unchanged. Facet joints intact throughout with mild degenerative changes. Exaggeration of the usual cervical lordosis, likely due to an exaggerated thoracic kyphosis. Craniocervical junction intact. C1-C2 articulation intact with severe degenerative changes. Calcified pannus posterior to the dens. Lateral masses intact.  Pleuroparenchymal scarring involving both lung apices with associated pleural calcification.  IMPRESSION: 1. No acute intracranial abnormality. 2. Stable moderate to severe generalized atrophy and moderate chronic microvascular ischemic changes of the white matter. 3. No cervical spine fractures identified. 4. Multilevel cervical disk disease as detailed above, most significantly a central disc extrusion at C5-6 with associated mild cord compression.   Electronically Signed   By: Hulan Saas M.D.   On: 09/02/2015 14:45   Ct Cervical Spine Wo Contrast  09/06/2015   CLINICAL DATA:  Recent fall from bed with headaches  and neck pain  EXAM: CT HEAD WITHOUT CONTRAST  CT CERVICAL SPINE WITHOUT CONTRAST  TECHNIQUE: Multidetector CT imaging of the head and cervical spine was performed following the standard protocol without intravenous contrast. Multiplanar CT image reconstructions of the cervical spine were also generated.  COMPARISON:  09/02/2015  FINDINGS: CT HEAD FINDINGS  The bony calvarium is intact. No gross soft tissue abnormality is seen. Changes of prior burr hole placement on the right are again noted. Atrophic changes are noted. No findings to suggest acute hemorrhage, acute infarction or space-occupying mass lesion are noted.  CT CERVICAL SPINE FINDINGS  Scarring is noted in the apices bilaterally. Seven cervical segments are well visualized. Vertebral body height is well maintained. Degenerative changes of the facet joints are noted. No acute fracture is seen. The surrounding soft tissue structures show evidence of multilevel disc bulging from C2-C5. No definitive cord  impingement is seen.  IMPRESSION: CT of the head: Atrophic changes without acute abnormality.  CT of the cervical spine: Multilevel degenerative change as described. No acute abnormality noted.   Electronically Signed   By: Alcide Clever M.D.   On: 09/06/2015 10:10   Ct Cervical Spine Wo Contrast  09/02/2015   CLINICAL DATA:  81 year old who had a possible unwitnessed fall out of bed at the nursing home earlier today, though the patient denies falling. Prior history of subdural hematoma.  EXAM: CT HEAD WITHOUT CONTRAST  CT CERVICAL SPINE WITHOUT CONTRAST  TECHNIQUE: Multidetector CT imaging of the head and cervical spine was performed following the standard protocol without intravenous contrast. Multiplanar CT image reconstructions of the cervical spine were also generated.  COMPARISON:  CT head and cervical spine 05/19/2011. CT head 09/17/2009 dating back to 07/13/2009.  FINDINGS: CT HEAD FINDINGS  Moderate to severe cortical, deep and cerebellar atrophy, not significantly changed dating back to 2010. Moderate changes of small vessel disease of the white matter diffusely, unchanged. No mass lesion. No midline shift. No acute hemorrhage or hematoma. No extra-axial fluid collections. No evidence of acute infarction. Calcification involving the meninges of the right frontal lobe approaching the vertex, likely dystrophic in related to the prior hematoma.  Prior right frontal and right temporal burr hole placement. No skull fractures. Visualized paranasal sinuses, bilateral mastoid air cells and bilateral middle ear cavities well-aerated. Severe bilateral carotid siphon and vertebral artery atherosclerosis.  CT CERVICAL SPINE FINDINGS  No fractures identified involving the cervical spine. Generalized osseous demineralization. Soft tissue window images demonstrate multilevel disc protrusions including a central and left paracentral protrusion at C2-3 with calcification in the posterior annular fibers, a central disc  protrusion at C3-4, a central disc extrusion at C4-5 with compression of the anterior cord, and a broad-based central disc protrusion at C5-6. The extrusion at C5-6 has progressed since the prior CT, while the other findings are unchanged. Facet joints intact throughout with mild degenerative changes. Exaggeration of the usual cervical lordosis, likely due to an exaggerated thoracic kyphosis. Craniocervical junction intact. C1-C2 articulation intact with severe degenerative changes. Calcified pannus posterior to the dens. Lateral masses intact.  Pleuroparenchymal scarring involving both lung apices with associated pleural calcification.  IMPRESSION: 1. No acute intracranial abnormality. 2. Stable moderate to severe generalized atrophy and moderate chronic microvascular ischemic changes of the white matter. 3. No cervical spine fractures identified. 4. Multilevel cervical disk disease as detailed above, most significantly a central disc extrusion at C5-6 with associated mild cord compression.   Electronically Signed   By: Kayren Eaves.D.  On: 09/02/2015 14:45   Dg Knee Complete 4 Views Left  09/06/2015   CLINICAL DATA:  Larey Seat out of bed this morning. Complaining of left leg pain.  EXAM: LEFT KNEE - COMPLETE 4+ VIEW  COMPARISON:  None.  FINDINGS: There is a fracture of the distal left femur. This extends along an oblique transverse plane, from the proximal medial metaphysis to the distal lateral metaphysis. Fracture is mildly displaced, posteriorly by 6 mm, as well as mildly impacted, by approximately 1 cm. No significant fracture angulation and no comminution.  No other fractures. Bones are diffusely demineralized. Knee joint is normally spaced and aligned. There is a small joint effusion.  There are dense vascular calcifications posteriorly.  IMPRESSION: 1. Fracture of the distal left femoral metaphysis as described.   Electronically Signed   By: Amie Portland M.D.   On: 09/06/2015 09:08   Dg Hip Unilat  With Pelvis 2-3 Views Left  09/06/2015   CLINICAL DATA:  Larey Seat out of bed this morning at skilled nursing facility, LEFT hip and leg pain, initial encounter  EXAM: DG HIP (WITH OR WITHOUT PELVIS) 2-3V LEFT  COMPARISON:  09/02/2015  FINDINGS: Marked osseous demineralization.  SI joints poorly visualized.  RIGHT hip joint space preserved.  Significant destruction of LEFT humeral head likely due to prior avascular necrosis with collapse.  Secondary degenerative changes of LEFT hip joint with acetabulum protrusio.  No acute fracture, dislocation, or additional bone destruction identified.  Significant scattered atherosclerotic calcifications.  Overall appearance stable since 09/02/2015  IMPRESSION: Osseous demineralization.  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  No acute fracture or dislocation identified.   Electronically Signed   By: Ulyses Southward M.D.   On: 09/06/2015 09:06   Dg Hip Unilat With Pelvis 2-3 Views Left  09/02/2015   CLINICAL DATA:  Left hip pain following a fall.  EXAM: DG HIP (WITH OR WITHOUT PELVIS) 2-3V LEFT  COMPARISON:  10/30/2012.  FINDINGS: Severe collapse of the left femoral head with progression. There is some residual heterogeneous sclerosis in the remaining portion of the femoral head, extending into the proximal neck. 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 No fracture or dislocation seen. Diffuse osteopenia. Arterial calcifications. Inferior vena cava filter.  IMPRESSION: 1. No fracture or dislocation. 2. Progressive collapse of the femoral head due to the avascular necrosis with significant bony remodeling of the acetabulum with protrusio. 3. Diffuse osteopenia.   Electronically Signed   By: Beckie Salts M.D.   On: 09/02/2015 13:10   Dg Femur 1v Left  09/06/2015   CLINICAL DATA:  Pain post fall from bed  EXAM: LEFT FEMUR 1 VIEW  COMPARISON:  09/02/2015  FINDINGS:  Advanced flattening and remodeling of the left femoral head and superior acetabulum with some subchondral sclerosis. No fracture or dislocation. Extensive femoral arterial calcifications. Transverse fracture of the distal femoral metadiaphysis , displaced at least 5 mm.  IMPRESSION: 1. Distal femoral shaft fracture, displaced 2. Advanced left hip degenerative changes as previously noted.   Electronically Signed   By: Corlis Leak M.D.   On: 09/06/2015 09:26   Dg Femur Min 2 Views Left  09/02/2015   CLINICAL DATA:  Fall.  Hip pain.  EXAM: LEFT FEMUR 2 VIEWS  COMPARISON:  No prior.  FINDINGS: Peripheral vascular calcification. Chronic deformity left hip is noted with severe flattening of what remains of the femoral head and protrusio acetabula. No evidence of  erosive arthropathy no acute fracture. Diffuse severe osteopenia. Peripheral vascular calcification.  IMPRESSION: Chronic changes as above.  No acute abnormality .   Electronically Signed   By: Maisie Fus  Register   On: 09/02/2015 13:35      ED Course  Procedures  1. Fracture, femur, distal, left, closed, initial encounter   2. Dysphagia   3. Essential hypertension     MDM  Plan: Social work to evaluate for further placement or increased services at ALF.    7:04 PM Patient evaluated by [employer] of social work. She reports that patient is unable to be placed in a skilled care facility due to her insurance status. Case management was contacted and gave a list of private duty sitters and physical therapist to patient's son who is at bedside. Social worker also discussed the situation with patient's daughter. Patient's pain is controlled at this time.  We will plan for discharge home with adequate pain control.  8:05 PM After discussion with patient's family. They're concerned about a choking episode that occurred earlier in the afternoon. Will obtain official swallow screen and reassess.  Discussed at length with son at bedside. He reports that  she has had increasing choking episodes in the last few months and he has noticed more coughing. She is afebrile here in the emergency department.  8:12 PM Patient unable to swallow even small sips of water. She will need admission for IV pain control and dysphagia workup.  8:30 PM Discussed with Dr. Lovell Sheehan who will admit to tele.    Dahlia Client Murdock Jellison, PA-C 09/06/15 2033  Alvira Monday, MD 09/07/15 [phone]

## 2015-09-06 NOTE — Progress Notes (Signed)
CSW spoke with Shuana/ Nurse of [facility] and informed her that the patient will be discharged soon. She is aware that the patient will coming back to facility.   Nurse Carroll Sage states nurse report can be given at [phone]  Trish Mage [phone] ED CSW 09/06/2015 7:04 PM

## 2015-09-06 NOTE — Clinical Social Work Note (Signed)
Clinical Social Work Assessment  Patient Details  Name: [name] MRN: [id number] Date of Birth: 03/10/1916  Date of referral:  09/06/15               Reason for consult:   (Patient is from [place].)                Permission sought to share information with:   (NONE.) Permission granted to share information::  No  Name::        Agency::     Relationship::     Contact Information:     Housing/Transportation Living arrangements for the past 2 months:  [place] of Information:  Adult Children Patient Interpreter Needed:  None Criminal Activity/Legal Involvement Pertinent to Current Situation/Hospitalization:  No - Comment as needed Significant Relationships:  Adult Children Lives with:  Facility Resident Do you feel safe going back to the place where you live?   (Children are interested in SNF.) Need for family participation in patient care:  Yes (Comment) (Son states that he and daughter are primary support.)  Care giving concerns:  Patient is coming from [facility] assisted living facility. Family is interested in SNF for patient at this time.   Social Worker assessment / plan:  CSW consulted with nurse who states that disposition has now changed, and that the patient is now being admitted. Nurse states that the patient is having trouble swallowing. Patient initially presented to [facility] due to fall.  CSW met with patient and son at bedside. Son confirms that he is interested in SNF.   Son confirms that the patient presents to [facility] due to fall. He states that this is the first incident that the patient has fallen within 6 months. Son states that the family has a great support system, which consist of him and his sister.  CSW educated family about medicare/ medicaid guidelines. CSW educated son and daughter about the differences between observation and medical admission. CSW also talked with family about private pay. CSW spoke with Nurse CM about patient.  Nurse CM offered gave son a list of private duty nursing agencies. CSW will give son a list of SNF.  Son/Tyrone [phone]      Employment status:  Retired Forensic scientist:  Medicare PT Recommendations:  Not assessed at this time Information / Referral to community resources:   (CSW provided son with list of SNF.)  Patient/Family's Response to care:  Children want patient to be admitted. Family is interested in SNF's and finding the patient a new placement.  Patient/Family's Understanding of and Emotional Response to Diagnosis, Current Treatment, and Prognosis:  CSW spoke with son who states that patient does have some memory loss, but has not been dx with dementia.  Son is understanding at this time.  Emotional Assessment Appearance:  Appears stated age Attitude/Demeanor/Rapport:   (Appropriate. ) Affect (typically observed):  Unable to Assess Orientation:  Fluctuating Orientation (Suspected and/or reported Sundowners) Alcohol / Substance use:  Not Applicable Psych involvement (Current and /or in the community):  No (Comment)  Discharge Needs  Concerns to be addressed:  Adjustment to Illness Readmission within the last 30 days:  No Current discharge risk:  None Barriers to Discharge:  No Barriers Identified   Bernita Buffy, LCSW 09/06/2015, 10:34 PM

## 2015-09-06 NOTE — Progress Notes (Signed)
CM offered pt and female family member a list of private duty nursing services Discussed that there might be a cost for sitters.  ED SW has spoke with staff at assisted living to find out that PT is available but facility wanting to inquire about a immobilizer for pt and intervention to prevent pt from getting out of bed and falling  Son verbalized understanding of private duty nursing for sitters to prevent injury to pt if she attempts to get out of bed Voiced understanding of out of pocket expense

## 2015-09-06 NOTE — ED Provider Notes (Addendum)
CSN: [id number]     Arrival date & time 09/06/15  0815 History   First MD Initiated Contact with Patient 09/06/15 0818     Chief Complaint  Patient presents with  . Hip Pain     (Consider location/radiation/quality/duration/timing/severity/associated sxs/prior Treatment) HPI [name] is a 81 y.o. female with history of coronary disease, hypertension, MI, DVT, A. fib, arthritis, presents emergency department from [place] assisted living facility after a fall. Patient apparently had a fall out of her bed this morning. She was found by staff, and was helped back to bed. This morning when it was time to get up, patient was unable to bear any weight on the left leg. Based on record review patient is only ambulatory with assistance to the bathroom, otherwise uses a wheelchair.  Past Medical History  Diagnosis Date  . Arthritis   . Angina   . Coronary artery disease   . Hypertension   . Myocardial infarction (HCC)   . Dysrhythmia   . DVT (deep venous thrombosis) (HCC)   . Subdural hematoma (HCC)   . Glaucoma   . Hearing loss   . Atrial fibrillation [facility])    Past Surgical History  Procedure Laterality Date  . Subdural hematoma evacuation via craniotomy     No family history on file. Social History  Substance Use Topics  . Smoking status: Never Smoker   . Smokeless tobacco: None  . Alcohol Use: No   OB History    No data available     Review of Systems  Constitutional: Negative for fever and chills.  Respiratory: Negative for cough, chest tightness and shortness of breath.   Cardiovascular: Negative for chest pain, palpitations and leg swelling.  Gastrointestinal: Negative for nausea, vomiting, abdominal pain and diarrhea.  Musculoskeletal: Positive for myalgias and arthralgias. Negative for neck pain and neck stiffness.  Skin: Negative for rash.  Neurological: Negative for dizziness, weakness and headaches.  All other systems reviewed and are  negative.     Allergies  Review of patient's allergies indicates no known allergies.  Home Medications   Prior to Admission medications   Medication Sig Start Date End Date Taking? Authorizing Provider  aspirin EC 81 MG tablet Take 81 mg by mouth daily.    Historical Provider, MD  bimatoprost (LUMIGAN) 0.01 % SOLN Place 1 drop into both eyes at bedtime.      Historical Provider, MD  Calcium Carbonate-Vitamin D (CALCARB 600/D) 600-400 MG-UNIT per tablet Take 1 tablet by mouth 2 (two) times daily.    Historical Provider, MD  carbamide peroxide (DEBROX) 6.5 % otic solution Place 5 drops into both ears 3 (three) times daily as needed (for affected areas).    Historical Provider, MD  carvedilol (COREG) 25 MG tablet Take 25 mg by mouth 2 (two) times daily with a meal.    Historical Provider, MD  Cholecalciferol (VITAMIN D PO) Take 800 Units by mouth daily.    Historical Provider, MD  diltiazem (CARDIZEM CD) 180 MG 24 hr capsule Take 180 mg by mouth daily.    Historical Provider, MD  divalproex (DEPAKOTE) 250 MG DR tablet Take 250 mg by mouth at bedtime.     Historical Provider, MD  dorzolamide-timolol (COSOPT) 22.3-6.8 MG/ML ophthalmic solution Place 1 drop into both eyes 2 (two) times daily.    Historical Provider, MD  ENSURE (ENSURE) Take 237 mLs by mouth daily.    Historical Provider, MD  furosemide (LASIX) 20 MG tablet Take 2 tablets (40 mg  total) by mouth daily. Patient not taking: Reported on 09/02/2015 11/27/11 06/28/15  Martha Clan, MD  HYDROcodone-acetaminophen [facility]) 5-325 MG tablet Take 2 tablets by mouth every 4 (four) hours as needed. 09/02/15   Joycie Peek, PA-C  lisinopril-hydrochlorothiazide (PRINZIDE,ZESTORETIC) 20-12.5 MG per tablet Take 2 tablets by mouth every morning.    Historical Provider, MD  mineral oil-hydrophilic petrolatum (AQUAPHOR) ointment Apply 1 application topically as needed for dry skin or irritation.    Historical Provider, MD  mirtazapine (REMERON) 15 MG  tablet Take 15 mg by mouth at bedtime.    Historical Provider, MD  ranitidine (ZANTAC) 150 MG tablet Take 150 mg by mouth every morning.    Historical Provider, MD   BP 122/57 mmHg  Pulse 89  Temp(Src) 97.6 F (36.4 C) (Oral)  Resp 18  SpO2 95% Physical Exam  Constitutional: She appears well-developed and well-nourished. No distress.  HENT:  Head: Normocephalic.  Eyes: Conjunctivae are normal.  Neck: Neck supple.  Cardiovascular: Normal rate, regular rhythm and normal heart sounds.   Pulmonary/Chest: Effort normal and breath sounds normal. No respiratory distress. She has no wheezes. She has no rales.  Abdominal: Soft. Bowel sounds are normal. She exhibits no distension. There is no tenderness. There is no rebound.  Musculoskeletal:  TTP to the left hip and left knee. Left knee appears swollen. Unable to move due to pain. DP pulses intact.   Neurological: She is alert.  Skin: Skin is warm and dry.  Psychiatric: She has a normal mood and affect. Her behavior is normal.  Nursing note and vitals reviewed.   ED Course  Procedures (including critical care time) Labs Review Labs Reviewed  URINALYSIS, ROUTINE W REFLEX MICROSCOPIC (NOT AT [facility])  CBC WITH DIFFERENTIAL/PLATELET  BASIC METABOLIC PANEL  PROTIME-INR    Imaging Review Dg Knee Complete 4 Views Left  09/06/2015   CLINICAL DATA:  Larey Seat out of bed this morning. Complaining of left leg pain.  EXAM: LEFT KNEE - COMPLETE 4+ VIEW  COMPARISON:  None.  FINDINGS: There is a fracture of the distal left femur. This extends along an oblique transverse plane, from the proximal medial metaphysis to the distal lateral metaphysis. Fracture is mildly displaced, posteriorly by 6 mm, as well as mildly impacted, by approximately 1 cm. No significant fracture angulation and no comminution.  No other fractures. Bones are diffusely demineralized. Knee joint is normally spaced and aligned. There is a small joint effusion.  There are dense vascular  calcifications posteriorly.  IMPRESSION: 1. Fracture of the distal left femoral metaphysis as described.   Electronically Signed   By: Amie Portland M.D.   On: 09/06/2015 09:08   Dg Hip Unilat With Pelvis 2-3 Views Left  09/06/2015   CLINICAL DATA:  Larey Seat out of bed this morning at skilled nursing facility, LEFT hip and leg pain, initial encounter  EXAM: DG HIP (WITH OR WITHOUT PELVIS) 2-3V LEFT  COMPARISON:  09/02/2015  FINDINGS: Marked osseous demineralization.  SI joints poorly visualized.  RIGHT hip joint space preserved.  Significant destruction of LEFT humeral head likely due to prior avascular necrosis with collapse.  Secondary degenerative changes of LEFT hip joint with acetabulum protrusio.  No acute fracture, dislocation, or additional bone destruction identified.  Significant scattered atherosclerotic calcifications.  Overall appearance stable since 09/02/2015  IMPRESSION: Osseous demineralization.  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  No acute fracture or dislocation identified.   Electronically Signed  By: Ulyses Southward M.D.   On: 09/06/2015 09:06   I have personally reviewed and evaluated these images and lab results as part of my medical decision-making.   EKG Interpretation None      MDM   Final diagnoses:  Fracture, femur, distal, left, closed, initial encounter    12:51 PM Spoke with   Dr. Eulah Pont, with orthopedics, and he advised to place in knee mobilizer, follow-up with him in the office next week. I tried to contact patient's son however unable to get in touch. Message left.   3:00 PM Son is at bedside, discussed with him the plan. We will get social worker involved, for possible placement to a higher level assisted facility. I spoke with Child psychotherapist, they will come by and see patient.  Patient signed at shift change pending social work assessment.  Jaynie Crumble,  PA-C 09/06/15 1717  Courteney Randall An, MD 09/09/15 [address] PA-C 10/22/15 1006  Courteney Randall An, MD 10/23/15 [phone]

## 2015-09-06 NOTE — H&P (Signed)
[facility] Admission History and Physical       [name] ZOX:[id number] DOB: 1916-09-15 DOA: 09/06/2015  Referring physician: EDP PCP: Florentina Jenny, MD  Specialists:   Chief Complaint: Fall  HPI: [name] is a 81 y.o. female with a history of CAD, Atrial Fibrillation, DVT, HTN, and Glaucoma who was sent from the [facility] after falling from her bed.    She had fallen in the early AM on 10/07 and was fond by staff and put back into bed, and at breakfast time when staff came to get her up for breakfast she was unable to stand and bear weight on her left leg.  Her son is at the bedside and assists with the history, and he reports that at baseline his mother is unable to walk, and is assisted to stand and be placed in a wheelchair.      EMS was called and it was observed that her left leg was shorter.    She was brought to the ED and was evaluated and was found to have a distal fracture of the Left Femur.   The EDP discussed the case with Orthopedics Dr. Eulah Pont, and the fracture was deemed nonsurgical since she is nonambulatory at baseline.      While in the ED, it was noticed that patient was havign choking speels with food , water and medications given by mouth.   Her son was asked and he reported that she was had been having choking spells for several months.   She was referred for admission for further evlauation of her dysphagia and a CT scan of the Head had been performed and was negative for acute findings.     Review of Systems: Unable to Obtain from the Patient   Past Medical History  Diagnosis Date  . Arthritis   . Angina   . Coronary artery disease   . Hypertension   . Myocardial infarction (HCC)   . Dysrhythmia   . DVT (deep venous thrombosis) (HCC)   . Subdural hematoma (HCC)   . Glaucoma   . Hearing loss   . Atrial fibrillation [facility])      Past Surgical History  Procedure Laterality Date  . Subdural hematoma evacuation via craniotomy         Prior to Admission medications   Medication Sig Start Date End Date Taking? Authorizing Provider  acetaminophen (TYLENOL) 500 MG tablet Take 500 mg by mouth every 6 (six) hours as needed for mild pain, moderate pain, fever or headache.   Yes Historical Provider, MD  aspirin EC 81 MG tablet Take 81 mg by mouth daily.   Yes Historical Provider, MD  bimatoprost (LUMIGAN) 0.01 % SOLN Place 1 drop into both eyes at bedtime.     Yes Historical Provider, MD  Calcium Carbonate-Vitamin D (CALCARB 600/D) 600-400 MG-UNIT per tablet Take 1 tablet by mouth 2 (two) times daily.   Yes Historical Provider, MD  carbamide peroxide (DEBROX) 6.5 % otic solution Place 5 drops into both ears 3 (three) times daily as needed (for affected areas).   Yes Historical Provider, MD  carvedilol (COREG) 25 MG tablet Take 25 mg by mouth 2 (two) times daily with a meal.   Yes Historical Provider, MD  Cholecalciferol (VITAMIN D PO) Take 800 Units by mouth daily.   Yes Historical Provider, MD  diltiazem (CARDIZEM CD) 180 MG 24 hr capsule Take 180 mg by mouth daily.   Yes Historical Provider, MD  divalproex (DEPAKOTE)  250 MG DR tablet Take 250 mg by mouth at bedtime.    Yes Historical Provider, MD  dorzolamide-timolol (COSOPT) 22.3-6.8 MG/ML ophthalmic solution Place 1 drop into both eyes 2 (two) times daily.   Yes Historical Provider, MD  ENSURE (ENSURE) Take 237 mLs by mouth daily.   Yes Historical Provider, MD  HYDROcodone-acetaminophen (NORCO) 5-325 MG tablet Take 2 tablets by mouth every 4 (four) hours as needed. Patient taking differently: Take 2 tablets by mouth every 4 (four) hours as needed (for pain).  09/02/15  Yes Joycie Peek, PA-C  lisinopril-hydrochlorothiazide (PRINZIDE,ZESTORETIC) 20-12.5 MG per tablet Take 2 tablets by mouth every morning.   Yes Historical Provider, MD  mineral oil-hydrophilic petrolatum (AQUAPHOR) ointment Apply 1 application topically as needed for dry skin or irritation.   Yes Historical  Provider, MD  mirtazapine (REMERON) 15 MG tablet Take 15 mg by mouth at bedtime.   Yes Historical Provider, MD  polyethylene glycol (MIRALAX / GLYCOLAX) packet Take 17 g by mouth daily as needed for mild constipation.   Yes Historical Provider, MD  ranitidine (ZANTAC) 150 MG tablet Take 150 mg by mouth every morning.   Yes Historical Provider, MD  traMADol (ULTRAM) 50 MG tablet Take 50 mg by mouth every 12 (twelve) hours as needed (for pain).   Yes Historical Provider, MD  furosemide (LASIX) 20 MG tablet Take 2 tablets (40 mg total) by mouth daily. Patient not taking: Reported on 09/02/2015 11/27/11 06/28/15  Martha Clan, MD     No Known Allergies  Social History:  reports that she has never smoked. She does not have any smokeless tobacco history on file. She reports that she does not drink alcohol or use illicit drugs.    No family history on file.     Physical Exam:  GEN:  Pleasant thin Elderly 81 y.o. African American female examined and in no acute distress; cooperative with exam Filed Vitals:   09/06/15 1930 09/06/15 2000 09/06/15 2030 09/06/15 2149  BP: 186/69 113/68 134/75 164/77  Pulse: 116  113 108  Temp:    97.6 F (36.4 C)  TempSrc:    Oral  Resp: 19 27 29 25   Height:    5\' 7"  (1.702 m)  SpO2: 100%  100% 100%   Blood pressure 164/77, pulse 108, temperature 97.6 F (36.4 C), temperature source Oral, resp. rate 25, height 5\' 7"  (1.702 m), SpO2 100 %. PSYCH: She is alert and oriented x4; does not appear anxious does not appear depressed; affect is normal HEENT: Normocephalic and Atraumatic, Mucous membranes pink; PERRLA; EOM intact; Fundi:  Benign;  No scleral icterus, Nares: Patent, Oropharynx: Unable to Visualize due to lack of Cooperation   Neck:  FROM, No Cervical Lymphadenopathy nor Thyromegaly or Carotid Bruit; No JVD; Breasts:: Not examined CHEST WALL: No tenderness CHEST: Normal respiration, clear to auscultation bilaterally HEART: Regular rate and rhythm; no  murmurs rubs or gallops BACK: No kyphosis or scoliosis; No CVA tenderness ABDOMEN: Positive Bowel Sounds, Scaphoid, Soft Non-Tender, No Rebound or Guarding; No Masses, No Organomegaly, No Pannus; No Intertriginous candida. Rectal Exam: Not done EXTREMITIES: No Cyanosis, Clubbing, or Edema; No Ulcerations. Genitalia: not examined PULSES: 2+ and symmetric SKIN: Normal hydration no rash or ulceration CNS:  Alert and Oriented x 4, No Focal Deficits except limiited LLE movement due to fracture and splint, and HOH.   Vascular: pulses palpable throughout    Labs on Admission:  Basic Metabolic Panel:  Recent Labs Lab 09/06/15 1119  NA 131*  K 4.6  CL 95*  CO2 27  GLUCOSE 124*  BUN 35*  CREATININE 0.94  CALCIUM 8.6*   Liver Function Tests: No results for input(s): AST, ALT, ALKPHOS, BILITOT, PROT, ALBUMIN in the last 168 hours. No results for input(s): LIPASE, AMYLASE in the last 168 hours. No results for input(s): AMMONIA in the last 168 hours. CBC:  Recent Labs Lab 09/06/15 1119  WBC 12.6*  NEUTROABS 10.5*  HGB 12.5  HCT 37.7  MCV 84.7  PLT 197   Cardiac Enzymes: No results for input(s): CKTOTAL, CKMB, CKMBINDEX, TROPONINI in the last 168 hours.  BNP (last 3 results)  Recent Labs  06/28/15 1700  BNP 875.8*    ProBNP (last 3 results) No results for input(s): PROBNP in the last 8760 hours.  CBG: No results for input(s): GLUCAP in the last 168 hours.  Radiological Exams on Admission: Dg Chest 2 View  09/06/2015   CLINICAL DATA:  Status post fall out of bed. Concern for chest injury. Initial encounter.  EXAM: CHEST  2 VIEW  COMPARISON:  Chest radiograph performed 06/28/2015  FINDINGS: The lungs are well-aerated. Peribronchial thickening is noted. Mild vascular congestion is seen. Mild bibasilar atelectasis is noted. Small bilateral pleural effusions are noted. There is no evidence of pneumothorax.  The heart is borderline enlarged. No acute osseous abnormalities are  seen.  IMPRESSION: Peribronchial thickening noted. Mild vascular congestion and borderline cardiomegaly noted. Mild bibasilar atelectasis and small bilateral pleural effusions noted. No displaced rib fracture seen.   Electronically Signed   By: Roanna Raider M.D.   On: 09/06/2015 21:18   Ct Head Wo Contrast  09/06/2015   CLINICAL DATA:  Recent fall from bed with headaches and neck pain  EXAM: CT HEAD WITHOUT CONTRAST  CT CERVICAL SPINE WITHOUT CONTRAST  TECHNIQUE: Multidetector CT imaging of the head and cervical spine was performed following the standard protocol without intravenous contrast. Multiplanar CT image reconstructions of the cervical spine were also generated.  COMPARISON:  09/02/2015  FINDINGS: CT HEAD FINDINGS  The bony calvarium is intact. No gross soft tissue abnormality is seen. Changes of prior burr hole placement on the right are again noted. Atrophic changes are noted. No findings to suggest acute hemorrhage, acute infarction or space-occupying mass lesion are noted.  CT CERVICAL SPINE FINDINGS  Scarring is noted in the apices bilaterally. Seven cervical segments are well visualized. Vertebral body height is well maintained. Degenerative changes of the facet joints are noted. No acute fracture is seen. The surrounding soft tissue structures show evidence of multilevel disc bulging from C2-C5. No definitive cord impingement is seen.  IMPRESSION: CT of the head: Atrophic changes without acute abnormality.  CT of the cervical spine: Multilevel degenerative change as described. No acute abnormality noted.   Electronically Signed   By: Alcide Clever M.D.   On: 09/06/2015 10:10   Ct Cervical Spine Wo Contrast  09/06/2015   CLINICAL DATA:  Recent fall from bed with headaches and neck pain  EXAM: CT HEAD WITHOUT CONTRAST  CT CERVICAL SPINE WITHOUT CONTRAST  TECHNIQUE: Multidetector CT imaging of the head and cervical spine was performed following the standard protocol without intravenous contrast.  Multiplanar CT image reconstructions of the cervical spine were also generated.  COMPARISON:  09/02/2015  FINDINGS: CT HEAD FINDINGS  The bony calvarium is intact. No gross soft tissue abnormality is seen. Changes of prior burr hole placement on the right are again noted. Atrophic changes are noted. No findings to suggest acute  hemorrhage, acute infarction or space-occupying mass lesion are noted.  CT CERVICAL SPINE FINDINGS  Scarring is noted in the apices bilaterally. Seven cervical segments are well visualized. Vertebral body height is well maintained. Degenerative changes of the facet joints are noted. No acute fracture is seen. The surrounding soft tissue structures show evidence of multilevel disc bulging from C2-C5. No definitive cord impingement is seen.  IMPRESSION: CT of the head: Atrophic changes without acute abnormality.  CT of the cervical spine: Multilevel degenerative change as described. No acute abnormality noted.   Electronically Signed   By: Alcide Clever M.D.   On: 09/06/2015 10:10   Dg Knee Complete 4 Views Left  09/06/2015   CLINICAL DATA:  Larey Seat out of bed this morning. Complaining of left leg pain.  EXAM: LEFT KNEE - COMPLETE 4+ VIEW  COMPARISON:  None.  FINDINGS: There is a fracture of the distal left femur. This extends along an oblique transverse plane, from the proximal medial metaphysis to the distal lateral metaphysis. Fracture is mildly displaced, posteriorly by 6 mm, as well as mildly impacted, by approximately 1 cm. No significant fracture angulation and no comminution.  No other fractures. Bones are diffusely demineralized. Knee joint is normally spaced and aligned. There is a small joint effusion.  There are dense vascular calcifications posteriorly.  IMPRESSION: 1. Fracture of the distal left femoral metaphysis as described.   Electronically Signed   By: Amie Portland M.D.   On: 09/06/2015 09:08   Dg Hip Unilat With Pelvis 2-3 Views Left  09/06/2015   CLINICAL DATA:  Larey Seat out  of bed this morning at skilled nursing facility, LEFT hip and leg pain, initial encounter  EXAM: DG HIP (WITH OR WITHOUT PELVIS) 2-3V LEFT  COMPARISON:  09/02/2015  FINDINGS: Marked osseous demineralization.  SI joints poorly visualized.  RIGHT hip joint space preserved.  Significant destruction of LEFT humeral head likely due to prior avascular necrosis with collapse.  Secondary degenerative changes of LEFT hip joint with acetabulum protrusio.  No acute fracture, dislocation, or additional bone destruction identified.  Significant scattered atherosclerotic calcifications.  Overall appearance stable since 09/02/2015  IMPRESSION: Osseous demineralization.  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  No acute fracture or dislocation identified.   Electronically Signed   By: Ulyses Southward M.D.   On: 09/06/2015 09:06   Dg Femur 1v Left  09/06/2015   CLINICAL DATA:  Pain post fall from bed  EXAM: LEFT FEMUR 1 VIEW  COMPARISON:  09/02/2015  FINDINGS: Advanced flattening and remodeling of the left femoral head and superior acetabulum with some subchondral sclerosis. No fracture or dislocation. Extensive femoral arterial calcifications. Transverse fracture of the distal femoral metadiaphysis , displaced at least 5 mm.  IMPRESSION: 1. Distal femoral shaft fracture, displaced 2. Advanced left hip degenerative changes as previously noted.   Electronically Signed   By: Corlis Leak M.D.   On: 09/06/2015 09:26      Assessment/Plan:      81 y.o. female with  Principal Problem:   1.    Dysphagia   NPO   Swallow Evaluation per Speech Therapy   Active Problems:   2.    Fracture, femur, distal, left, closed, initial encounter   Pain Control PRN with IV DIlaudid     3.    Fall from bed   Mechanical in [place]     4.    Atrial fibrillation [facility])- Not  a Coumadin Candidate due to hx of SDH   On Diltiazem Rx and Lopressor     IV Lopressor while NPO for rate control     5.    Coronary artery disease   stable     6.    Essential hypertension   IV Lopressor while NPO   Monitor BPs     7.    DVT Prophylaxis   Lovenox   Code Status:    DO NOT RESUSCITATE (DNR)      Family Communication:   Son at Bedside       Disposition Plan:    Inpatient  Status        Time spent:  1 Minutes      Ron Parker [facility] Pager [phone]   If 7AM -7PM Please Contact the Day Rounding Team MD for [facility]  If 7PM-7AM, Please Contact Night-Floor Coverage  [email or web address] Password [email or web address] 09/06/2015, 10:35 PM     ADDENDUM:   Patient was seen and examined on 09/06/2015

## 2015-09-06 NOTE — ED Notes (Signed)
Pt choking on one sip of water. Made pt's son aware that don't feel comfortable with trying to administer a pill when pt having difficulty swallowing water at this time.   Made Tatyana PA aware.  Medication reordered for IV route instead of PO.

## 2015-09-07 DIAGNOSIS — I10 Essential (primary) hypertension: Secondary | ICD-10-CM

## 2015-09-07 DIAGNOSIS — S72402A Unspecified fracture of lower end of left femur, initial encounter for closed fracture: Secondary | ICD-10-CM

## 2015-09-07 DIAGNOSIS — I480 Paroxysmal atrial fibrillation: Secondary | ICD-10-CM

## 2015-09-07 DIAGNOSIS — R1310 Dysphagia, unspecified: Secondary | ICD-10-CM

## 2015-09-07 LAB — BASIC METABOLIC PANEL
Anion gap: 10 (ref 5–15)
BUN: 35 mg/dL — AB (ref 6–20)
CALCIUM: 8.2 mg/dL — AB (ref 8.9–10.3)
CO2: 23 mmol/L (ref 22–32)
CREATININE: 0.84 mg/dL (ref 0.44–1.00)
Chloride: 103 mmol/L (ref 101–111)
GFR calc Af Amer: >60 mL/min (ref >60)
GFR, EST NON AFRICAN AMERICAN: 57 mL/min — AB (ref >60)
GLUCOSE: 113 mg/dL — AB (ref 65–99)
POTASSIUM: 4 mmol/L (ref 3.5–5.1)
SODIUM: 136 mmol/L (ref 135–145)

## 2015-09-07 LAB — CBC
HCT: 32.7 % — ABNORMAL LOW (ref 36.0–46.0)
Hemoglobin: 10.5 g/dL — ABNORMAL LOW (ref 12.0–15.0)
MCH: 28 pg (ref 26.0–34.0)
MCHC: 32.1 g/dL (ref 30.0–36.0)
MCV: 87.2 fL (ref 78.0–100.0)
PLATELETS: 166 10*3/uL (ref 150–400)
RBC: 3.75 MIL/uL — ABNORMAL LOW (ref 3.87–5.11)
RDW: 15.1 % (ref 11.5–15.5)
WBC: 9.3 10*3/uL (ref 4.0–10.5)

## 2015-09-07 LAB — MRSA PCR SCREENING: MRSA by PCR: NEGATIVE

## 2015-09-07 MED ORDER — METOPROLOL TARTRATE 1 MG/ML IV SOLN
2.5000 mg | Freq: Four times a day (QID) | INTRAVENOUS | Status: DC
  Administered 2015-09-07 – 2015-09-10 (×13): 2.5 mg via INTRAVENOUS
  Filled 2015-09-07 (×14): qty 5

## 2015-09-07 NOTE — Progress Notes (Signed)
Spoke with patient's son, Delila Pereyra, by phone who reports his sister who is [place] is on her way from [place] as well as they other 2 sons to plan to meet with the MD tomorrow to discuss prognosis, goals of care, etc. Son sounds very realistic in regards to her issues and the lack of quality there. "she's had a good life- she's 89". Support provided and will advise covering CSW tomorrow and weekday of this change of plans.   Reece Levy, MSW, [facility]  [phone]

## 2015-09-07 NOTE — Evaluation (Signed)
SLP Cancellation Note  Patient Details Name: [name] MRN: [id number] DOB: 15-Oct-1916   Cancelled treatment:       Reason Eval/Treat Not Completed: Patient's level of consciousness (pt is not tracking SLP with her eyes, nor following commands, answering questions, son Delila Pereyra reports premorbid dysphagia to drink causing her to cough and her spitting out foods, son educated to rec,will continue efforts,continue npo with oral care rec)   Donavan Burnet, MS [facility] SLP [phone]

## 2015-09-07 NOTE — Progress Notes (Signed)
Attempted to place foley cath due to urinary retention, but patient voided large amount of urine. Same was not placed.

## 2015-09-07 NOTE — Progress Notes (Signed)
[facility] PROGRESS NOTE  [name] MVH:[id number] DOB: 08/29/1916 DOA: 09/06/2015 PCP: Florentina Jenny, MD  Assessment/Plan: 1. Dysphagia- currently patient nothing by mouth space therapy evaluation ordered. Will follow the recommendations 2. Femur fracture- patient deemed not a surgical candidate by orthopedics, continue when necessary Dilaudid 3. Atrial fibrillation- patient is not a Coumadin candidate due to the history of subdural hematoma. Currently on IV Lopressor 2.5 mg every 6 hours 4. Essential hypertension- continue IV Lopressor 5. DVT prophylaxis- Lovenox  Code Status: DNR Family Communication: *No family present at bedside Disposition Plan: Will discuss with family regarding goals of care   Consultants:  Orthopedics  Procedures:  None  Antibiotics:  None  HPI/Subjective: 81 y.o. female with a history of CAD, Atrial Fibrillation, DVT, HTN, and Glaucoma who was sent from the [facility] ALF after falling from her bed. She had fallen in the early AM on 10/07 and was fond by staff and put back into bed, and at breakfast time when staff came to get her up for breakfast she was unable to stand and bear weight on her left leg. Her son is at the bedside and assists with the history, and he reports that at baseline his mother is unable to walk, and is assisted to stand and be placed in a wheelchair. EMS was called and it was observed that her left leg was shorter. She was brought to the ED and was evaluated and was found to have a distal fracture of the Left Femur. The EDP discussed the case with Orthopedics Dr. Eulah Pont, and the fracture was deemed nonsurgical since she is nonambulatory at baseline. Patient found to have choking spells with food water and medication in the ED.  This morning patient continues to be lethargic.  Objective: Filed Vitals:   09/07/15 0448  BP: 145/76  Pulse: 111  Temp: 98.4 F (36.9 C)  Resp: 22    Intake/Output Summary  (Last 24 hours) at 09/07/15 1300 Last data filed at 09/07/15 0600  Gross per 24 hour  Intake    520 ml  Output      0 ml  Net    520 ml   Filed Weights   09/07/15 0100  Weight: 50.848 kg (112 lb 1.6 oz)    Exam:   General:  Lethargic, not following commands  Cardiovascular: S1-S2 regular  Respiratory: Clear to auscultation bilaterally  Abdomen: Soft, nontender, no organomegaly  Musculoskeletal: No cyanosis/clubbing/edema of the lower extremities   Data Reviewed: Basic Metabolic Panel:  Recent Labs Lab 09/06/15 1119 09/07/15 0542  NA 131* 136  K 4.6 4.0  CL 95* 103  CO2 27 23  GLUCOSE 124* 113*  BUN 35* 35*  CREATININE 0.94 0.84  CALCIUM 8.6* 8.2*   Liver Function Tests: No results for input(s): AST, ALT, ALKPHOS, BILITOT, PROT, ALBUMIN in the last 168 hours. No results for input(s): LIPASE, AMYLASE in the last 168 hours. No results for input(s): AMMONIA in the last 168 hours. CBC:  Recent Labs Lab 09/06/15 1119 09/07/15 0542  WBC 12.6* 9.3  NEUTROABS 10.5*  --   HGB 12.5 10.5*  HCT 37.7 32.7*  MCV 84.7 87.2  PLT 197 166   Cardiac Enzymes: No results for input(s): CKTOTAL, CKMB, CKMBINDEX, TROPONINI in the last 168 hours. BNP (last 3 results)  Recent Labs  06/28/15 1700  BNP 875.8*    ProBNP (last 3 results) No results for input(s): PROBNP in the last 8760 hours.  CBG: No results for input(s): GLUCAP in  the last 168 hours.  Recent Results (from the past 240 hour(s))  MRSA PCR Screening     Status: None   Collection Time: 09/07/15  1:09 AM  Result Value Ref Range Status   MRSA by PCR NEGATIVE NEGATIVE Final    Comment:        The GeneXpert MRSA Assay (FDA approved for NASAL specimens only), is one component of a comprehensive MRSA colonization surveillance program. It is not intended to diagnose MRSA infection nor to guide or monitor treatment for MRSA infections.      Studies: Dg Chest 2 View  09/06/2015   CLINICAL DATA:   Status post fall out of bed. Concern for chest injury. Initial encounter.  EXAM: CHEST  2 VIEW  COMPARISON:  Chest radiograph performed 06/28/2015  FINDINGS: The lungs are well-aerated. Peribronchial thickening is noted. Mild vascular congestion is seen. Mild bibasilar atelectasis is noted. Small bilateral pleural effusions are noted. There is no evidence of pneumothorax.  The heart is borderline enlarged. No acute osseous abnormalities are seen.  IMPRESSION: Peribronchial thickening noted. Mild vascular congestion and borderline cardiomegaly noted. Mild bibasilar atelectasis and small bilateral pleural effusions noted. No displaced rib fracture seen.   Electronically Signed   By: Roanna Raider M.D.   On: 09/06/2015 21:18   Ct Head Wo Contrast  09/06/2015   CLINICAL DATA:  Recent fall from bed with headaches and neck pain  EXAM: CT HEAD WITHOUT CONTRAST  CT CERVICAL SPINE WITHOUT CONTRAST  TECHNIQUE: Multidetector CT imaging of the head and cervical spine was performed following the standard protocol without intravenous contrast. Multiplanar CT image reconstructions of the cervical spine were also generated.  COMPARISON:  09/02/2015  FINDINGS: CT HEAD FINDINGS  The bony calvarium is intact. No gross soft tissue abnormality is seen. Changes of prior burr hole placement on the right are again noted. Atrophic changes are noted. No findings to suggest acute hemorrhage, acute infarction or space-occupying mass lesion are noted.  CT CERVICAL SPINE FINDINGS  Scarring is noted in the apices bilaterally. Seven cervical segments are well visualized. Vertebral body height is well maintained. Degenerative changes of the facet joints are noted. No acute fracture is seen. The surrounding soft tissue structures show evidence of multilevel disc bulging from C2-C5. No definitive cord impingement is seen.  IMPRESSION: CT of the head: Atrophic changes without acute abnormality.  CT of the cervical spine: Multilevel degenerative  change as described. No acute abnormality noted.   Electronically Signed   By: Alcide Clever M.D.   On: 09/06/2015 10:10   Ct Cervical Spine Wo Contrast  09/06/2015   CLINICAL DATA:  Recent fall from bed with headaches and neck pain  EXAM: CT HEAD WITHOUT CONTRAST  CT CERVICAL SPINE WITHOUT CONTRAST  TECHNIQUE: Multidetector CT imaging of the head and cervical spine was performed following the standard protocol without intravenous contrast. Multiplanar CT image reconstructions of the cervical spine were also generated.  COMPARISON:  09/02/2015  FINDINGS: CT HEAD FINDINGS  The bony calvarium is intact. No gross soft tissue abnormality is seen. Changes of prior burr hole placement on the right are again noted. Atrophic changes are noted. No findings to suggest acute hemorrhage, acute infarction or space-occupying mass lesion are noted.  CT CERVICAL SPINE FINDINGS  Scarring is noted in the apices bilaterally. Seven cervical segments are well visualized. Vertebral body height is well maintained. Degenerative changes of the facet joints are noted. No acute fracture is seen. The surrounding soft tissue structures show  evidence of multilevel disc bulging from C2-C5. No definitive cord impingement is seen.  IMPRESSION: CT of the head: Atrophic changes without acute abnormality.  CT of the cervical spine: Multilevel degenerative change as described. No acute abnormality noted.   Electronically Signed   By: Alcide Clever M.D.   On: 09/06/2015 10:10   Dg Knee Complete 4 Views Left  09/06/2015   CLINICAL DATA:  Larey Seat out of bed this morning. Complaining of left leg pain.  EXAM: LEFT KNEE - COMPLETE 4+ VIEW  COMPARISON:  None.  FINDINGS: There is a fracture of the distal left femur. This extends along an oblique transverse plane, from the proximal medial metaphysis to the distal lateral metaphysis. Fracture is mildly displaced, posteriorly by 6 mm, as well as mildly impacted, by approximately 1 cm. No significant fracture  angulation and no comminution.  No other fractures. Bones are diffusely demineralized. Knee joint is normally spaced and aligned. There is a small joint effusion.  There are dense vascular calcifications posteriorly.  IMPRESSION: 1. Fracture of the distal left femoral metaphysis as described.   Electronically Signed   By: Amie Portland M.D.   On: 09/06/2015 09:08   Dg Hip Unilat With Pelvis 2-3 Views Left  09/06/2015   CLINICAL DATA:  Larey Seat out of bed this morning at skilled nursing facility, LEFT hip and leg pain, initial encounter  EXAM: DG HIP (WITH OR WITHOUT PELVIS) 2-3V LEFT  COMPARISON:  09/02/2015  FINDINGS: Marked osseous demineralization.  SI joints poorly visualized.  RIGHT hip joint space preserved.  Significant destruction of LEFT humeral head likely due to prior avascular necrosis with collapse.  Secondary degenerative changes of LEFT hip joint with acetabulum protrusio.  No acute fracture, dislocation, or additional bone destruction identified.  Significant scattered atherosclerotic calcifications.  Overall appearance stable since 09/02/2015  IMPRESSION: Osseous demineralization.  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  No acute fracture or dislocation identified.   Electronically Signed   By: Ulyses Southward M.D.   On: 09/06/2015 09:06   Dg Femur 1v Left  09/06/2015   CLINICAL DATA:  Pain post fall from bed  EXAM: LEFT FEMUR 1 VIEW  COMPARISON:  09/02/2015  FINDINGS: Advanced flattening and remodeling of the left femoral head and superior acetabulum with some subchondral sclerosis. No fracture or dislocation. Extensive femoral arterial calcifications. Transverse fracture of the distal femoral metadiaphysis , displaced at least 5 mm.  IMPRESSION: 1. Distal femoral shaft fracture, displaced 2. Advanced left hip degenerative changes as previously noted.   Electronically Signed   By: Corlis Leak M.D.   On:  09/06/2015 09:26    Scheduled Meds: . enoxaparin (LOVENOX) injection  30 mg Subcutaneous Daily  . fentaNYL (SUBLIMAZE) injection  25 mcg Intravenous Once  . fentaNYL (SUBLIMAZE) injection  50 mcg Intravenous Once  . HYDROcodone-acetaminophen  1 tablet Oral Once  . latanoprost  1 drop Both Eyes QHS  . metoprolol  2.5 mg Intravenous 4 times per day  . sodium chloride  3 mL Intravenous Q12H   Continuous Infusions: . sodium chloride 75 mL/hr at 09/07/15 0455    Principal Problem:   Dysphagia Active Problems:   Atrial fibrillation [facility])   Coronary artery disease   Fracture, femur, distal, left, closed, initial encounter   Essential hypertension   Fall from bed    Time spent: 25 min    [facility]  [facility] Pager [phone]*. If 7PM-7AM,  please contact night-coverage at [email or web address], password [facility] 09/07/2015, 1:00 PM  LOS: 1 day

## 2015-09-08 DIAGNOSIS — W06XXXA Fall from bed, initial encounter: Secondary | ICD-10-CM

## 2015-09-08 MED ORDER — VITAMINS A & D EX OINT
TOPICAL_OINTMENT | CUTANEOUS | Status: AC
  Administered 2015-09-08: 19:00:00
  Filled 2015-09-08: qty 5

## 2015-09-08 NOTE — Evaluation (Signed)
Clinical/Bedside Swallow Evaluation Patient Details  Name: MAKAYLEE SPIELBERG MRN: [id number] Date of Birth: 1916-10-24  Today's Date: 09/08/2015 Time: SLP Start Time (ACUTE ONLY): 1545 SLP Stop Time (ACUTE ONLY): 1620 SLP Time Calculation (min) (ACUTE ONLY): 35 min  Past Medical History:  Past Medical History  Diagnosis Date  . Arthritis   . Angina   . Coronary artery disease   . Hypertension   . Myocardial infarction (HCC)   . Dysrhythmia   . DVT (deep venous thrombosis) (HCC)   . Subdural hematoma (HCC)   . Glaucoma   . Hearing loss   . Atrial fibrillation [facility])    Past Surgical History:  Past Surgical History  Procedure Laterality Date  . Subdural hematoma evacuation via craniotomy     HPI:  ANEIRA CAVITT is a 81 y.o. female with a history of CAD, Atrial Fibrillation, DVT, HTN, and Glaucoma who was sent from the [facility]F after falling from her bed. She had fallen in the early AM on 10/07 and was found by staff and put back into bed, and at breakfast time when staff came to get her up for breakfast she was unable to stand and bear weight on her left leg. Her son reported that at baseline his mother is unable to walk, and is assisted to stand and be placed in a wheelchair.Pt found to have a distal fracture of the Left Femur. Per MD note, the fracture was deemed nonsurgical since she is nonambulatory at baseline.Pt had difficulty swallowing water - causing her to cough and speech/swallow evaluation was ordered.   CXR showed small B effusions.  CT head negative.     Assessment / Plan / Recommendation Clinical Impression  Pt today much more alert and opening eyes to SLP and son communication.  She did not remain alert however and would frequently close her eyes during the session.  Pt resistant to po intake, sealing lips tightly closed when offered despite tactile stimulation to labia and verbal cues from SLP and son.    SLP did place tsp of nectar juice in oral cavity to  determine if would trigger automatic swallow responses.  Pt orally held bolus but did eventually swallow with delay and no overt indications of difficulties.   However intake does not appear comforting for pt or desired at this time.    Of note, pt did expectorate x1 after liquid provided - suspect portion of bolus retained.  Son report this also occured today after oral care provided by staff.  Informed RN to pt's posture when oral secretions/bolus retained and pt ability to expectorate if cup is held to her mouth.    Son reports main goal is for pt to be comfortable at this time and he indicates hospice may be in the plan.  Provided son with toothettes to faciliate pt's mouth care /comfort advising to keep in front of pt's dentition, due to her resistance to open her mouth at times.  He did state pt expressed desire for water one time only since admitted, advised toothette for moisture.  Will follow up if indicated dependent on goals of care.      Aspiration Risk  Severe    Diet Recommendation NPO (oral care via toothette for comfort)        Other  Recommendations   oral care  Follow Up Recommendations    n/a   Frequency and Duration min 1 x/week  1 week   Pertinent Vitals/Pain Afebrile, decreased  Swallow Study Prior Functional Status   see hhx, premorbid dysphagia reported by son     General Date of Onset: 09/06/15 Other Pertinent Information: QUYNN VILCHIS is a 81 y.o. female with a history of CAD, Atrial Fibrillation, DVT, HTN, and Glaucoma who was sent from the [facility] ALF after falling from her bed. She had fallen in the early AM on 10/07 and was found by staff and put back into bed, and at breakfast time when staff came to get her up for breakfast she was unable to stand and bear weight on her left leg. Her son reported that at baseline his mother is unable to walk, and is assisted to stand and be placed in a wheelchair.Pt found to have a distal fracture of the  Left Femur. Per MD note, the fracture was deemed nonsurgical since she is nonambulatory at baseline.Pt had difficulty swallowing water - causing her to cough and speech/swallow evaluation was ordered.   CXR showed small B effusions.  CT head negative.   Type of Study: Bedside swallow evaluation Diet Prior to this Study: NPO Temperature Spikes Noted: No Respiratory Status: Supplemental O2 delivered via (comment) Behavior/Cognition: Alert;Cooperative;Pleasant mood Oral Cavity - Dentition: Edentulous (upper denture) Self-Feeding Abilities: Total assist Patient Positioning: Partially reclined (partially upright due to discomfort from fx) Baseline Vocal Quality: Normal Volitional Cough: Cognitively unable to elicit Volitional Swallow: Unable to elicit    Oral/Motor/Sensory Function Overall Oral Motor/Sensory Function: Other (comment) (pt did not consistently follow directions, able to seal lips closed tightly on spoon and bilateral palatal elevation noted)   Ice Chips Ice chips: Impaired Presentation: Spoon Oral Phase Impairments: Other (comment);Poor awareness of bolus (pt closed lips tightly and did not accept ice chip bolus, despite stimulation to labia)   Thin Liquid Thin Liquid: Impaired Presentation: Spoon Oral Phase Impairments: Poor awareness of bolus (pt closed lips tightly and did not accept bolus, despite stimulation to labia)    Nectar Thick Nectar Thick Liquid: Impaired Presentation: Spoon Oral Phase Impairments: Poor awareness of bolus (pt closed lips tightly and did not accept bolus, despite stimulation to labia- slp successful at getting a tsp of nectar into pt's oral cavity x1) Oral phase functional implications: Prolonged oral transit Pharyngeal Phase Impairments: Suspected delayed Swallow;Decreased hyoid-laryngeal movement   Honey Thick Honey Thick Liquid: Not tested   Puree Puree: Not tested   Solid   GO    Solid: Not tested       Donavan Burnet, MS [facility]  SLP [phone]

## 2015-09-08 NOTE — Progress Notes (Signed)
Initial Nutrition Assessment  DOCUMENTATION CODES:   Severe malnutrition in context of chronic illness, Underweight  INTERVENTION:   Diet advancement per MD RD to continue to monitor for plan and GOC  NUTRITION DIAGNOSIS:   Malnutrition related to chronic illness as evidenced by severe depletion of body fat, severe depletion of muscle mass.  GOAL:   Patient will meet greater than or equal to 90% of their needs  MONITOR:   Diet advancement, Labs, Weight trends, Skin, I & O's  REASON FOR ASSESSMENT:   Malnutrition Screening Tool    ASSESSMENT:   81 y.o. female with a history of CAD, Atrial Fibrillation, DVT, HTN, and Glaucoma who was sent from the [facility]F after falling from her bed.  Pt in room with no family at bedside. Pt nonverbal and unable to provide history. Per H&P, pt has been having coughing spells with intake. SLP recommending pt remain NPO at this time. Family expected to meet to discuss GOC.  No recent weight history available.  Nutrition-Focused physical exam completed. Findings are severe fat depletion, severe muscle depletion, and no edema.   Labs reviewed: Elevated BUN  Diet Order:  Diet NPO time specified  Skin:  Reviewed, no issues  Last BM:  PTA  Height:   Ht Readings from Last 1 Encounters:  09/06/15  (1.702 m)    Weight:   Wt Readings from Last 1 Encounters:  09/07/15 112 lb 1.6 oz (50.848 kg)    Ideal Body Weight:  61.4 kg  BMI:  Body mass index is 17.55 kg/(m^2).  Estimated Nutritional Needs:   Kcal:  1200-1400  Protein:  75-85g  Fluid:  1.4L/day  EDUCATION NEEDS:   No education needs identified at this time  Audrey Franco, MS, RD, LDN Pager: [phone] After Hours Pager: [phone]

## 2015-09-08 NOTE — Progress Notes (Signed)
[facility] PROGRESS NOTE  [name] MWU:[id number] DOB: March 05, 1916 DOA: 09/06/2015 PCP: Florentina Jenny, MD  Assessment/Plan: 1. Dysphagia- currently patient nothing by mouth space therapy evaluated the patient but patient not following commands. 2. Femur fracture- patient deemed not a surgical candidate by orthopedics, continue when necessary Dilaudid 3. Atrial fibrillation- patient is not a Coumadin candidate due to the history of subdural hematoma. Currently on IV Lopressor 2.5 mg every 6 hours 4. Essential hypertension- continue IV Lopressor 5. DVT prophylaxis- Lovenox  Code Status: DNR Family Communication: *No family present at bedside Disposition Plan:  Discussed with son, will get palliative medicine consult for goals of care   Consultants:  Orthopedics  Procedures:  None  Antibiotics:  None  HPI/Subjective: 81 y.o. female with a history of CAD, Atrial Fibrillation, DVT, HTN, and Glaucoma who was sent from the [facility]F after falling from her bed. She had fallen in the early AM on 10/07 and was fond by staff and put back into bed, and at breakfast time when staff came to get her up for breakfast she was unable to stand and bear weight on her left leg. Her son is at the bedside and assists with the history, and he reports that at baseline his mother is unable to walk, and is assisted to stand and be placed in a wheelchair. EMS was called and it was observed that her left leg was shorter. She was brought to the ED and was evaluated and was found to have a distal fracture of the Left Femur. The EDP discussed the case with Orthopedics Dr. Eulah Pont, and the fracture was deemed nonsurgical since she is nonambulatory at baseline. Patient found to have choking spells with food water and medication in the ED.  This morning patient continues to be lethargic.  Objective: Filed Vitals:   09/08/15 1227  BP: 146/68  Pulse: 82  Temp:   Resp:    No intake or  output data in the 24 hours ending 09/08/15 1345 Filed Weights   09/07/15 0100  Weight: 50.848 kg (112 lb 1.6 oz)    Exam:   General:  Lethargic, not following commands  Cardiovascular: S1-S2 regular  Respiratory: Clear to auscultation bilaterally  Abdomen: Soft, nontender, no organomegaly  Musculoskeletal: No cyanosis/clubbing/edema of the lower extremities   Data Reviewed: Basic Metabolic Panel:  Recent Labs Lab 09/06/15 1119 09/07/15 0542  NA 131* 136  K 4.6 4.0  CL 95* 103  CO2 27 23  GLUCOSE 124* 113*  BUN 35* 35*  CREATININE 0.94 0.84  CALCIUM 8.6* 8.2*   Liver Function Tests: No results for input(s): AST, ALT, ALKPHOS, BILITOT, PROT, ALBUMIN in the last 168 hours. No results for input(s): LIPASE, AMYLASE in the last 168 hours. No results for input(s): AMMONIA in the last 168 hours. CBC:  Recent Labs Lab 09/06/15 1119 09/07/15 0542  WBC 12.6* 9.3  NEUTROABS 10.5*  --   HGB 12.5 10.5*  HCT 37.7 32.7*  MCV 84.7 87.2  PLT 197 166   Cardiac Enzymes: No results for input(s): CKTOTAL, CKMB, CKMBINDEX, TROPONINI in the last 168 hours. BNP (last 3 results)  Recent Labs  06/28/15 1700  BNP 875.8*    ProBNP (last 3 results) No results for input(s): PROBNP in the last 8760 hours.  CBG: No results for input(s): GLUCAP in the last 168 hours.  Recent Results (from the past 240 hour(s))  MRSA PCR Screening     Status: None   Collection Time: 09/07/15  1:09  AM  Result Value Ref Range Status   MRSA by PCR NEGATIVE NEGATIVE Final    Comment:        The GeneXpert MRSA Assay (FDA approved for NASAL specimens only), is one component of a comprehensive MRSA colonization surveillance program. It is not intended to diagnose MRSA infection nor to guide or monitor treatment for MRSA infections.      Studies: Dg Chest 2 View  09/06/2015   CLINICAL DATA:  Status post fall out of bed. Concern for chest injury. Initial encounter.  EXAM: CHEST  2 VIEW   COMPARISON:  Chest radiograph performed 06/28/2015  FINDINGS: The lungs are well-aerated. Peribronchial thickening is noted. Mild vascular congestion is seen. Mild bibasilar atelectasis is noted. Small bilateral pleural effusions are noted. There is no evidence of pneumothorax.  The heart is borderline enlarged. No acute osseous abnormalities are seen.  IMPRESSION: Peribronchial thickening noted. Mild vascular congestion and borderline cardiomegaly noted. Mild bibasilar atelectasis and small bilateral pleural effusions noted. No displaced rib fracture seen.   Electronically Signed   By: Roanna Raider M.D.   On: 09/06/2015 21:18    Scheduled Meds: . enoxaparin (LOVENOX) injection  30 mg Subcutaneous Daily  . fentaNYL (SUBLIMAZE) injection  25 mcg Intravenous Once  . HYDROcodone-acetaminophen  1 tablet Oral Once  . latanoprost  1 drop Both Eyes QHS  . metoprolol  2.5 mg Intravenous 4 times per day  . sodium chloride  3 mL Intravenous Q12H   Continuous Infusions: . sodium chloride 75 mL/hr at 09/07/15 0455    Principal Problem:   Dysphagia Active Problems:   Atrial fibrillation [facility])   Coronary artery disease   Fracture, femur, distal, left, closed, initial encounter   Essential hypertension   Fall from bed    Time spent: 25 min    [facility]  [facility] Pager [phone]*. If 7PM-7AM, please contact night-coverage at [email or web address], password [facility] 09/08/2015, 1:45 PM  LOS: 2 days

## 2015-09-08 NOTE — Progress Notes (Signed)
Pt restless throughout the night. Pulling and tugging at IV lines. Medicated as with pain med as ordered. Will cont to monitor. Pulled IV out times 2 last PM.  Hand mittens placed on pt to prevent pulling at IV site. SRP, RN

## 2015-09-09 DIAGNOSIS — Z515 Encounter for palliative care: Secondary | ICD-10-CM | POA: Insufficient documentation

## 2015-09-09 DIAGNOSIS — G3183 Dementia with Lewy bodies: Secondary | ICD-10-CM

## 2015-09-09 DIAGNOSIS — F02818 Dementia in other diseases classified elsewhere, unspecified severity, with other behavioral disturbance: Secondary | ICD-10-CM | POA: Insufficient documentation

## 2015-09-09 DIAGNOSIS — F0281 Dementia in other diseases classified elsewhere with behavioral disturbance: Secondary | ICD-10-CM

## 2015-09-09 NOTE — Care Management Important Message (Signed)
importantImportant Message  Patient Details  Name: [name] MRN: [id number] Date of Birth: 05/28/1916   Medicare Important Message Given:  Yes-second notification given    Haskell Flirt 09/09/2015, 1:39 PMImportant Message  Patient Details  Name: [name] MRN: [id number] Date of Birth: 30-Aug-1916   Medicare Important Message Given:  Yes-second notification given    Haskell Flirt 09/09/2015, 1:39 PM

## 2015-09-09 NOTE — Progress Notes (Signed)
[facility] PROGRESS NOTE  [name] TAV:[id number] DOB: 1915/12/21 DOA: 09/06/2015 PCP: [name], MD  Assessment/Plan: 1. Dysphagia- no aggressive measures for family, patient to go to residential hospice. 2. Femur fracture- patient deemed not a surgical candidate by orthopedics, continue when necessary Dilaudid 3. Atrial fibrillation- patient is not a Coumadin candidate due to the history of subdural hematoma. Currently on IV Lopressor 2.5 mg every 6 hours 4. Essential hypertension- continue IV Lopressor 5. DVT prophylaxis- Lovenox  Code Status: DNR Family Communication: No family present at bedside Disposition Plan:  Palliative care met with the family and the family agree with comfort measures, patient to be transferred to residential hospice.   Consultants:  Orthopedics  Procedures:  None  Antibiotics:  None  HPI/Subjective: 81 y.o. female with a history of CAD, Atrial Fibrillation, DVT, HTN, and Glaucoma who was sent from the [facility] ALF after falling from her bed. She had fallen in the early AM on 10/07 and was fond by staff and put back into bed, and at breakfast time when staff came to get her up for breakfast she was unable to stand and bear weight on her left leg. Her son is at the bedside and assists with the history, and he reports that at baseline his mother is unable to walk, and is assisted to stand and be placed in a wheelchair. EMS was called and it was observed that her left leg was shorter. She was brought to the ED and was evaluated and was found to have a distal fracture of the Left Femur. The EDP discussed the case with Orthopedics Dr. Percell Miller, and the fracture was deemed nonsurgical since she is nonambulatory at baseline. Patient found to have choking spells with food water and medication in the ED.  This morning patient continues to be lethargic. Patient was seen by palliative care.  Objective: Filed Vitals:   09/09/15 1737   BP: 184/88  Pulse: 107  Temp:   Resp: 16    Intake/Output Summary (Last 24 hours) at 09/09/15 1750 Last data filed at 09/09/15 1200  Gross per 24 hour  Intake 1127.5 ml  Output      0 ml  Net 1127.5 ml   Filed Weights   09/07/15 0100  Weight: 50.848 kg (112 lb 1.6 oz)    Exam:   General:  Lethargic, not following commands  Cardiovascular: S1-S2 regular  Respiratory: Clear to auscultation bilaterally  Abdomen: Soft, nontender, no organomegaly  Musculoskeletal: No cyanosis/clubbing/edema of the lower extremities   Data Reviewed: Basic Metabolic Panel:  Recent Labs Lab 09/06/15 1119 09/07/15 0542  NA 131* 136  K 4.6 4.0  CL 95* 103  CO2 27 23  GLUCOSE 124* 113*  BUN 35* 35*  CREATININE 0.94 0.84  CALCIUM 8.6* 8.2*   CBC:  Recent Labs Lab 09/06/15 1119 09/07/15 0542  WBC 12.6* 9.3  NEUTROABS 10.5*  --   HGB 12.5 10.5*  HCT 37.7 32.7*  MCV 84.7 87.2  PLT 197 166   Cardiac Enzymes: No results for input(s): CKTOTAL, CKMB, CKMBINDEX, TROPONINI in the last 168 hours. BNP (last 3 results)  Recent Labs  06/28/15 1700  BNP 875.8*     CBG: No results for input(s): GLUCAP in the last 168 hours.  Recent Results (from the past 240 hour(s))  MRSA PCR Screening     Status: None   Collection Time: 09/07/15  1:09 AM  Result Value Ref Range Status   MRSA by PCR NEGATIVE NEGATIVE Final  Comment:        The GeneXpert MRSA Assay (FDA approved for NASAL specimens only), is one component of a comprehensive MRSA colonization surveillance program. It is not intended to diagnose MRSA infection nor to guide or monitor treatment for MRSA infections.      Studies: No results found.  Scheduled Meds: . enoxaparin (LOVENOX) injection  30 mg Subcutaneous Daily  . fentaNYL (SUBLIMAZE) injection  25 mcg Intravenous Once  . HYDROcodone-acetaminophen  1 tablet Oral Once  . latanoprost  1 drop Both Eyes QHS  . metoprolol  2.5 mg Intravenous 4 times per  day  . sodium chloride  3 mL Intravenous Q12H   Continuous Infusions: . sodium chloride 75 mL/hr at 09/09/15 1251    Principal Problem:   Dysphagia Active Problems:   Atrial fibrillation [facility])   Coronary artery disease   Fracture, femur, distal, left, closed, initial encounter   Essential hypertension   Fall from bed   Lewy body dementia with behavioral disturbance   Encounter for palliative care    Time spent: 25 min    [facility]  [facility] Pager [phone]*. If 7PM-7AM, please contact night-coverage at [email or web address], password [facility] 09/09/2015, 5:50 PM  LOS: 3 days

## 2015-09-09 NOTE — Progress Notes (Addendum)
CSW continuing to follow.   CSW received notification from PMT MD, Dr. Rowe Pavy following PMT [place] with pt family that recommendation is for residential hospice.   CSW met with pt son and multiple family members in conference room. CSW offered choice for residential hospice. Pt family chooses [facility] for residential hospice placement. CSW discussed process of referral to residential hospice. CSW encouraged family to consider other options for residential hospice in case for any reason that [facility] is full. Pt family expressed understanding. Pt family eager to know determination from [facility] Pt son, Jiles Prows stated that he is the primary contact for pt.  CSW made referral to [facility], [facility]. [facility] to review pt information and notify this CSW of availability.   CSW to continue to follow to provide support and assist with pt disposition needs.   Addendum 5:05 pm:  CSW received notification from [facility] that pt has bed available tomorrow at [facility]. [employer] liaison stated that pt daughter and pt son are aware and agreeable to transfer tomorrow.  CSW notified MD.  CSW to facilitate pt discharge needs to [facility] tomorrow.   Alison Murray, MSW, [place] Work [phone]

## 2015-09-09 NOTE — Progress Notes (Signed)
Speech Language Pathology Treatment: Dysphagia  Patient Details Name: ALYCIA COOPERWOOD MRN: [id number] DOB: 06-12-1916 Today's Date: 09/09/2015 Time: 4098-1191 SLP Time Calculation (min) (ACUTE ONLY): 8 min  Assessment / Plan / Recommendation Clinical Impression  Upon family request, SLP re attempted diagnostic po to determine patient potential to advance to a po diet. Patient with fluctuating periods of alertness and lethargy. Daughter and daughter-in-law present and supportive. Po trials presented by this SLP as well as both family members with consistent results. Patient continues to purse lips in refusal. With max tactile cueing, oral cavity opened however patient with deliberate appearing movement of tongue attempting to push bolus out of mouth. Small amount of bolus placed on tongue with eventual spontaneous and deliberate expectoration of bolus. At this time, pos do not appear pleasurable or desired by patient with the knowledge that cognitive deficits are impacting functional abilities as well. Discussed briefly with patient's family. SLP will continue to f/u. Conversation between family and palliative care team will be helpful in determining goals of care.    HPI Other Pertinent Information: ALENA BLANKENBECKLER is a 81 y.o. female with a history of CAD, Atrial Fibrillation, DVT, HTN, and Glaucoma who was sent from the [facility] ALF after falling from her bed. She had fallen in the early AM on 10/07 and was found by staff and put back into bed, and at breakfast time when staff came to get her up for breakfast she was unable to stand and bear weight on her left leg. Her son reported that at baseline his mother is unable to walk, and is assisted to stand and be placed in a wheelchair.Pt found to have a distal fracture of the Left Femur. Per MD note, the fracture was deemed nonsurgical since she is nonambulatory at baseline.Pt had difficulty swallowing water - causing her to cough and  speech/swallow evaluation was ordered.   CXR showed small B effusions.  CT head negative.     Pertinent Vitals Pain Assessment: Faces Faces Pain Scale: No hurt  SLP Plan  Continue with current plan of care    Recommendations Diet recommendations: NPO Medication Administration: Via alternative means              Oral Care Recommendations: Oral care QID Follow up Recommendations:  (TBD) Plan: Continue with current plan of care    GO   Ferdinand Lango MA, CCC-SLP [phone]   Iori Gigante Meryl 09/09/2015, 79:29 PM

## 2015-09-09 NOTE — Progress Notes (Addendum)
CSW continuing to follow.   Per progression meeting and report, pt admitted from ALF, but pt family wanting to seek SNF. Palliative Medicine consulted for GOC.  CSW contacted pt son, [name]. Pt son, [name] reports that pt family is meeting with Palliative Medicine team this afternoon at 2:30 pm. CSW explained to pt son that CSW will follow up regarding recommendations from palliative care consult in order to appropriately assist with pt discharge planning needs.   CSW to continue to follow and await recommendations of disposition planning.  Loletta Specter, MSW, LCSW Clinical Social Work [phone]

## 2015-09-09 NOTE — Progress Notes (Signed)
Speech Language Pathology Treatment: Dysphagia  Patient Details Name: [name] MRN: [id number] DOB: 08-28-1916 Today's Date: 09/09/2015 Time: 7829-5621 SLP Time Calculation (min) (ACUTE ONLY): 12 min  Assessment / Plan / Recommendation Clinical Impression  Patient with significant cognitive deficits and lethargy impacting ability to functionally consume pos at this time. Patient initially alert and communicative although language confused and often incoherent. Unable to follow commands. Po trials presented however despite max verbal, visual, tactile and contextual cues provided, patient pursing lips at presentation of bolus. No oral response to bolus left on lips. Unable to proceed with in depth po trials today. RN and MD informed. Will continue to f/u pending results of discussion between palliative team and family for goals of care.    HPI Other Pertinent Information: [name] is a 81 y.o. female with a history of CAD, Atrial Fibrillation, DVT, HTN, and Glaucoma who was sent from the [facility] ALF after falling from her bed. She had fallen in the early AM on 10/07 and was found by staff and put back into bed, and at breakfast time when staff came to get her up for breakfast she was unable to stand and bear weight on her left leg. Her son reported that at baseline his mother is unable to walk, and is assisted to stand and be placed in a wheelchair.Pt found to have a distal fracture of the Left Femur. Per MD note, the fracture was deemed nonsurgical since she is nonambulatory at baseline.Pt had difficulty swallowing water - causing her to cough and speech/swallow evaluation was ordered.   CXR showed small B effusions.  CT head negative.     Pertinent Vitals Pain Assessment: Faces Faces Pain Scale: No hurt  SLP Plan  Continue with current plan of care    Recommendations Diet recommendations: NPO Medication Administration: Via alternative means              Oral Care  Recommendations: Oral care QID Follow up Recommendations:  (TBD) Plan: Continue with current plan of care    GO   Ferdinand Lango MA, CCC-SLP [phone]   Raileigh Sabater Meryl 09/09/2015, 11:13 AM

## 2015-09-09 NOTE — Consult Note (Addendum)
Consultation Note Date: 09/09/2015   Patient Name: [name]  DOB: 1916/04/04  MRN: [id number]  Age / Sex: 81 y.o., female   PCP: Audrey Jenny, MD Referring Physician: Meredeth Ide, MD  Reason for Consultation: Establishing goals of care  Palliative Care Assessment and Plan Summary of Established Goals of Care and Medical Treatment Preferences  81 y.o. female with a history of CAD, Atrial Fibrillation, DVT, HTN, and Glaucoma who was sent from the [facility] ALF after falling from her bed. She had fallen in the early AM on 10/07 and was fond by staff and put back into bed, and at breakfast time when staff came to get her up for breakfast she was unable to stand and bear weight on her left leg. Her son is at the bedside and assists with the history, and he reports that at baseline his mother is unable to walk, and is assisted to stand and be placed in a wheelchair. EMS was called and it was observed that her left leg was shorter. She was brought to the ED and was evaluated and was found to have a distal fracture of the Left Femur. The EDP discussed the case with Orthopedics Dr. Eulah [name], and the fracture was deemed nonsurgical since she is nonambulatory at baseline. Patient found to have choking spells with food water and medication in the ED. The patient has since been admitted to the hospitalist service. Active issues during this hospitalization include dysfunction, femur fracture, atrial fibrillation. The patient is not a candidate for anticoagulation because of falls and history of subdural hematoma. The patient has been deemed not a surgical candidate by orthopedics. The patient continues with as needed Dilaudid. Patient has been evaluated extensively by speech therapy in this hospitalization. She remains with dysphasia. The concept of artificial nutrition and artificial hydration at end-of-life discussed in extensive detail with family members present at the bedside. Discussed concept  of comfort feedings. Addressed that a semipermanent procedure such as a PEG tube insertion is not advised given the patient's current acute and chronic medical conditions.  DNR/DNI reconfirmed. Discussed in family meeting with the patient's children about appropriateness of addition of hospice services. Discussed about transferring the patient to hospice facility for symptom management and comfort at the end of life. Discussed about prognosis possibly being days to weeks category.  Brief life review also performed. Patient is described by her children as an excellent cook. She also loved to bake. She was living with her daughter [name]'s partner in [place]. The patient recently moved to the [facility] area July 2016. The patient has been diagnosed with mild dementia, has been having frequent falls. Discussed patient's gradual progressive subacute functional and mental decline over the course of the past several months.  All questions answered to the satisfaction of the patient's family members. Discussed with social worker [name]. Hospice consult. Transfer to hospice facility when bed available.    Contacts/Participants in Discussion: Primary Decision Maker:  Patient, then her son [name] and daughter [name] who are her co health care power of attorney agents   HCPOA: yes     Code Status/Advance Care Planning:  DNR DNI  Comfort measures only   Symptom Management:   Pain management, management of agitation.  Palliative Prophylaxis: yes  Additional Recommendations (Limitations, Scope, Preferences):  Hospice consult  Transfer to inpatient hospice based on bed availability  Comfort feeds, NO PEG Psycho-social/Spiritual:   Support System: strong, several children.   Desire for further Chaplaincy support:no  Prognosis: < 2 weeks  Discharge Planning:  Hospice facility    Values: comfort at end of life  Life limiting illness: inoperable LE fracture in an  elderly lady with dementia, frailty, bed bound status, A fib, PNA      Chief Complaint/History of Present Illness: fracture   Primary Diagnoses  Present on Admission:  . Fracture, femur, distal, left, closed, initial encounter . Essential hypertension . Atrial fibrillation (HCC) . Coronary artery disease . Fall from bed  Palliative Review of Systems:  completed I have reviewed the medical record, interviewed the patient and family, and examined the patient. The following aspects are pertinent.  Past Medical History  Diagnosis Date  . Arthritis   . Angina   . Coronary artery disease   . Hypertension   . Myocardial infarction (HCC)   . Dysrhythmia   . DVT (deep venous thrombosis) (HCC)   . Subdural hematoma (HCC)   . Glaucoma   . Hearing loss   . Atrial fibrillation [facility])    Social History   Social History  . Marital Status: Widowed    Spouse Name: N/A  . Number of Children: N/A  . Years of Education: N/A   Social History Main Topics  . Smoking status: Never Smoker   . Smokeless tobacco: None  . Alcohol Use: No  . Drug Use: No  . Sexual Activity: No   Other Topics Concern  . None   Social History Narrative   No family history on file. Scheduled Meds: . enoxaparin (LOVENOX) injection  30 mg Subcutaneous Daily  . fentaNYL (SUBLIMAZE) injection  25 mcg Intravenous Once  . HYDROcodone-acetaminophen  1 tablet Oral Once  . latanoprost  1 drop Both Eyes QHS  . metoprolol  2.5 mg Intravenous 4 times per day  . sodium chloride  3 mL Intravenous Q12H   Continuous Infusions: . sodium chloride 75 mL/hr at 09/08/15 2301   PRN Meds:.acetaminophen **OR** acetaminophen, fentaNYL (SUBLIMAZE) injection, HYDROmorphone (DILAUDID) injection, ondansetron **OR** ondansetron (ZOFRAN) IV Medications Prior to Admission:  Prior to Admission medications   Medication Sig Start Date End Date Taking? Authorizing Provider  acetaminophen (TYLENOL) 500 MG tablet Take 500 mg by mouth  every 6 (six) hours as needed for mild pain, moderate pain, fever or headache.   Yes Historical Provider, MD  aspirin EC 81 MG tablet Take 81 mg by mouth daily.   Yes Historical Provider, MD  bimatoprost (LUMIGAN) 0.01 % SOLN Place 1 drop into both eyes at bedtime.     Yes Historical Provider, MD  Calcium Carbonate-Vitamin D (CALCARB 600/D) 600-400 MG-UNIT per tablet Take 1 tablet by mouth 2 (two) times daily.   Yes Historical Provider, MD  carbamide peroxide (DEBROX) 6.5 % otic solution Place 5 drops into both ears 3 (three) times daily as needed (for affected areas).   Yes Historical Provider, MD  carvedilol (COREG) 25 MG tablet Take 25 mg by mouth 2 (two) times daily with a meal.   Yes Historical Provider, MD  Cholecalciferol (VITAMIN D PO) Take 800 Units by mouth daily.   Yes Historical Provider, MD  diltiazem (CARDIZEM CD) 180 MG 24 hr capsule Take 180 mg by mouth daily.   Yes Historical Provider, MD  divalproex (DEPAKOTE) 250 MG DR tablet Take 250 mg by mouth at bedtime.    Yes Historical Provider, MD  dorzolamide-timolol (COSOPT) 22.3-6.8 MG/ML ophthalmic solution Place 1 drop into both eyes 2 (two) times daily.   Yes Historical Provider, MD  ENSURE (ENSURE)  Take 237 mLs by mouth daily.   Yes Historical Provider, MD  HYDROcodone-acetaminophen (NORCO) 5-325 MG tablet Take 2 tablets by mouth every 4 (four) hours as needed. Patient taking differently: Take 2 tablets by mouth every 4 (four) hours as needed (for pain).  09/02/15  Yes Joycie Peek, PA-C  lisinopril-hydrochlorothiazide (PRINZIDE,ZESTORETIC) 20-12.5 MG per tablet Take 2 tablets by mouth every morning.   Yes Historical Provider, MD  mineral oil-hydrophilic petrolatum (AQUAPHOR) ointment Apply 1 application topically as needed for dry skin or irritation.   Yes Historical Provider, MD  mirtazapine (REMERON) 15 MG tablet Take 15 mg by mouth at bedtime.   Yes Historical Provider, MD  polyethylene glycol (MIRALAX / GLYCOLAX) packet Take  17 g by mouth daily as needed for mild constipation.   Yes Historical Provider, MD  ranitidine (ZANTAC) 150 MG tablet Take 150 mg by mouth every morning.   Yes Historical Provider, MD  traMADol (ULTRAM) 50 MG tablet Take 50 mg by mouth every 12 (twelve) hours as needed (for pain).   Yes Historical Provider, MD  furosemide (LASIX) 20 MG tablet Take 2 tablets (40 mg total) by mouth daily. Patient not taking: Reported on 09/02/2015 11/27/11 06/28/15  Martha Clan, MD   No Known Allergies CBC:    Component Value Date/Time   WBC 9.3 09/07/2015 0542   HGB 10.5* 09/07/2015 0542   HCT 32.7* 09/07/2015 0542   PLT 166 09/07/2015 0542   MCV 87.2 09/07/2015 0542   NEUTROABS 10.5* 09/06/2015 1119   LYMPHSABS 0.7 09/06/2015 1119   MONOABS 1.4* 09/06/2015 1119   EOSABS 0.0 09/06/2015 1119   BASOSABS 0.0 09/06/2015 1119   Comprehensive Metabolic Panel:    Component Value Date/Time   NA 136 09/07/2015 0542   K 4.0 09/07/2015 0542   CL 103 09/07/2015 0542   CO2 23 09/07/2015 0542   BUN 35* 09/07/2015 0542   CREATININE 0.84 09/07/2015 0542   GLUCOSE 113* 09/07/2015 0542   CALCIUM 8.2* 09/07/2015 0542   AST 9 11/21/2011 0700   ALT 8 11/21/2011 0700   ALKPHOS 73 11/21/2011 0700   BILITOT 0.5 11/21/2011 0700   PROT 7.0 11/21/2011 0700   ALBUMIN 2.4* 11/21/2011 0700    Physical Exam: Vital Signs: BP 154/79 mmHg  Pulse 121  Temp(Src) 97.4 F (36.3 C) (Axillary)  Resp 18  Ht 5\' 7"  (1.702 m)  Wt 50.848 kg (112 lb 1.6 oz)  BMI 17.55 kg/m2  SpO2 95% SpO2: SpO2: 95 % O2 Device: O2 Device: Nasal Cannula O2 Flow Rate: O2 Flow Rate (L/min): 2.5 L/min Intake/output summary:  Intake/Output Summary (Last 24 hours) at 09/09/15 0915 Last data filed at 09/09/15 0602  Gross per 24 hour  Intake 1727.5 ml  Output      0 ml  Net 1727.5 ml   LBM:   Baseline Weight: Weight: 50.848 kg (112 lb 1.6 oz) Most recent weight: Weight: 50.848 kg (112 lb 1.6 oz)  Exam Findings:   weak elderly lady wearing  mittens Shallow breathing diminished bases LLE everted externally rotated Abdomen soft Confused, is able to state name                       Palliative Performance Scale:  30%  Additional Data Reviewed: Recent Labs     09/06/15  1119  09/07/15  0542  WBC  12.6*  9.3  HGB  12.5  10.5*  PLT  197  166  NA  131*  136  BUN  35*  35*  CREATININE  0.94  0.84     Time In: 1400 Time Out: 1530 Time Total: 90 min  Greater than 50%  of this time was spent counseling and coordinating care related to the above assessment and plan.  Signed by: Rosalin Hawking, MD  [id number] Rosalin Hawking, MD  09/09/2015, 9:15 AM  Please contact Palliative Medicine Team phone at [phone] for questions and concerns.

## 2015-09-10 DIAGNOSIS — Z515 Encounter for palliative care: Secondary | ICD-10-CM

## 2015-09-10 MED ORDER — MORPHINE SULFATE (CONCENTRATE) 10 MG/0.5ML PO SOLN: 5.0000 mg | ORAL | Status: AC | PRN

## 2015-09-10 MED ORDER — MORPHINE SULFATE (CONCENTRATE) 10 MG/0.5ML PO SOLN
5.0000 mg | ORAL | Status: DC | PRN
  Administered 2015-09-10: 5 mg via ORAL
  Filled 2015-09-10: qty 0.5

## 2015-09-10 NOTE — Progress Notes (Addendum)
Report given to Rosey Bath, Charity fundraiser at [facility]. All questions answered.

## 2015-09-10 NOTE — Progress Notes (Signed)
Notified Rosey Bath, Charity fundraiser at [facility] of pain medication given prior to transport. Rosey Bath stated understanding.

## 2015-09-10 NOTE — Consult Note (Signed)
[facility] Liaison: Spoke with patient's son and daughter yesterday afternoon to answer questions and confirm interest. Plan to meet family at 8:30 this morning to complete registration paper work. Dr. Kern Reap to assume care per family request.   Please fax discharge summary to [phone].  RN Please call report to [phone].  Please arrange transport for as early in day as possible.   Thank you.  Forrestine Him, LCSW [phone]

## 2015-09-10 NOTE — Discharge Summary (Signed)
Physician Discharge Summary  [name] ZOX:[id number] DOB: 04-12-1916 DOA: 09/06/2015  PCP: Florentina Jenny, MD  Admit date: 09/06/2015 Discharge date: 09/10/2015  Time spent: 25* minutes  Recommendations for Outpatient Follow-up:  1. Patient to be discharged to residential hospice for end of life care  Discharge Diagnoses:  Principal Problem:   Dysphagia Active Problems:   Atrial fibrillation (HCC)   Coronary artery disease   Fracture, femur, distal, left, closed, initial encounter   Essential hypertension   Fall from bed   Lewy body dementia with behavioral disturbance   Encounter for palliative care   Discharge Condition: Stable  Diet recommendation: Comfort diet  Filed Weights   09/07/15 0100  Weight: 50.848 kg (112 lb 1.6 oz)    History of present illness:  81 y.o. female with a history of CAD, Atrial Fibrillation, DVT, HTN, and Glaucoma who was sent from the [facility] after falling from her bed. She had fallen in the early AM on 10/07 and was fond by staff and put back into bed, and at breakfast time when staff came to get her up for breakfast she was unable to stand and bear weight on her left leg. Her son is at the bedside and assists with the history, and he reports that at baseline his mother is unable to walk, and is assisted to stand and be placed in a wheelchair. EMS was called and it was observed that her left leg was shorter. She was brought to the ED and was evaluated and was found to have a distal fracture of the Left Femur. The EDP discussed the case with Orthopedics Dr. Eulah Pont, and the fracture was deemed nonsurgical since she is nonambulatory at baseline. Patient found to have choking spells with food water and medication in the ED.   Hospital Course:  1. Dysphagia- Arville Lime was seen by speech therapy, but was not following commands for the speech evaluation, no aggressive measures for family, patient to go to residential hospice. 2. Femur  fracture- patient deemed not a surgical candidate by orthopedics, continue when necessary sublingual morphine. 3. Atrial fibrillation- patient is not a Coumadin candidate due to the history of subdural hematoma. Was started  on IV Lopressor 2.5 mg every 6 hours. Will continue with Coreg and Ceardizem if able to take po. 4. Essential hypertension- Cardizem, Coreg.  Procedures:  None  Consultations:  None  Discharge Exam: Filed Vitals:   09/10/15 0549  BP: 155/67  Pulse:   Temp: 98.2 F (36.8 C)  Resp: 16    General: Appears in  no acute distress Cardiovascular: S1S2 RRR Respiratory: Clear bilaterally  Discharge Instructions   Discharge Instructions    Diet - low sodium heart healthy    Complete by:  As directed      Increase activity slowly    Complete by:  As directed           Current Discharge Medication List    START taking these medications   Details  Morphine Sulfate (MORPHINE CONCENTRATE) 10 MG/0.5ML SOLN concentrated solution Take 0.25 mLs (5 mg total) by mouth every 4 (four) hours as needed for severe pain. Qty: 120 mL, Refills: 0      CONTINUE these medications which have NOT CHANGED   Details  acetaminophen (TYLENOL) 500 MG tablet Take 500 mg by mouth every 6 (six) hours as needed for mild pain, moderate pain, fever or headache.    aspirin EC 81 MG tablet Take 81 mg by mouth daily.  bimatoprost (LUMIGAN) 0.01 % SOLN Place 1 drop into both eyes at bedtime.      carvedilol (COREG) 25 MG tablet Take 25 mg by mouth 2 (two) times daily with a meal.    diltiazem (CARDIZEM CD) 180 MG 24 hr capsule Take 180 mg by mouth daily.    divalproex (DEPAKOTE) 250 MG DR tablet Take 250 mg by mouth at bedtime.     dorzolamide-timolol (COSOPT) 22.3-6.8 MG/ML ophthalmic solution Place 1 drop into both eyes 2 (two) times daily.    mirtazapine (REMERON) 15 MG tablet Take 15 mg by mouth at bedtime.    polyethylene glycol (MIRALAX / GLYCOLAX) packet Take 17 g by  mouth daily as needed for mild constipation.    ranitidine (ZANTAC) 150 MG tablet Take 150 mg by mouth every morning.      STOP taking these medications     Calcium Carbonate-Vitamin D (CALCARB 600/D) 600-400 MG-UNIT per tablet      carbamide peroxide (DEBROX) 6.5 % otic solution      Cholecalciferol (VITAMIN D PO)      ENSURE (ENSURE)      HYDROcodone-acetaminophen (NORCO) 5-325 MG tablet      lisinopril-hydrochlorothiazide (PRINZIDE,ZESTORETIC) 20-12.5 MG per tablet      mineral oil-hydrophilic petrolatum (AQUAPHOR) ointment      traMADol (ULTRAM) 50 MG tablet      furosemide (LASIX) 20 MG tablet        No Known Allergies    The results of significant diagnostics from this hospitalization (including imaging, microbiology, ancillary and laboratory) are listed below for reference.    Significant Diagnostic Studies: Dg Chest 2 View  09/06/2015   CLINICAL DATA:  Status post fall out of bed. Concern for chest injury. Initial encounter.  EXAM: CHEST  2 VIEW  COMPARISON:  Chest radiograph performed 06/28/2015  FINDINGS: The lungs are well-aerated. Peribronchial thickening is noted. Mild vascular congestion is seen. Mild bibasilar atelectasis is noted. Small bilateral pleural effusions are noted. There is no evidence of pneumothorax.  The heart is borderline enlarged. No acute osseous abnormalities are seen.  IMPRESSION: Peribronchial thickening noted. Mild vascular congestion and borderline cardiomegaly noted. Mild bibasilar atelectasis and small bilateral pleural effusions noted. No displaced rib fracture seen.   Electronically Signed   By: Roanna Raider M.D.   On: 09/06/2015 21:18   Ct Head Wo Contrast  09/06/2015   CLINICAL DATA:  Recent fall from bed with headaches and neck pain  EXAM: CT HEAD WITHOUT CONTRAST  CT CERVICAL SPINE WITHOUT CONTRAST  TECHNIQUE: Multidetector CT imaging of the head and cervical spine was performed following the standard protocol without intravenous  contrast. Multiplanar CT image reconstructions of the cervical spine were also generated.  COMPARISON:  09/02/2015  FINDINGS: CT HEAD FINDINGS  The bony calvarium is intact. No gross soft tissue abnormality is seen. Changes of prior burr hole placement on the right are again noted. Atrophic changes are noted. No findings to suggest acute hemorrhage, acute infarction or space-occupying mass lesion are noted.  CT CERVICAL SPINE FINDINGS  Scarring is noted in the apices bilaterally. Seven cervical segments are well visualized. Vertebral body height is well maintained. Degenerative changes of the facet joints are noted. No acute fracture is seen. The surrounding soft tissue structures show evidence of multilevel disc bulging from C2-C5. No definitive cord impingement is seen.  IMPRESSION: CT of the head: Atrophic changes without acute abnormality.  CT of the cervical spine: Multilevel degenerative change as described. No acute abnormality  noted.   Electronically Signed   By: Alcide Clever M.D.   On: 09/06/2015 10:10   Ct Head Wo Contrast  09/02/2015   CLINICAL DATA:  81 year old who had a possible unwitnessed fall out of bed at the nursing home earlier today, though the patient denies falling. Prior history of subdural hematoma.  EXAM: CT HEAD WITHOUT CONTRAST  CT CERVICAL SPINE WITHOUT CONTRAST  TECHNIQUE: Multidetector CT imaging of the head and cervical spine was performed following the standard protocol without intravenous contrast. Multiplanar CT image reconstructions of the cervical spine were also generated.  COMPARISON:  CT head and cervical spine 05/19/2011. CT head 09/17/2009 dating back to 07/13/2009.  FINDINGS: CT HEAD FINDINGS  Moderate to severe cortical, deep and cerebellar atrophy, not significantly changed dating back to 2010. Moderate changes of small vessel disease of the white matter diffusely, unchanged. No mass lesion. No midline shift. No acute hemorrhage or hematoma. No extra-axial fluid  collections. No evidence of acute infarction. Calcification involving the meninges of the right frontal lobe approaching the vertex, likely dystrophic in related to the prior hematoma.  Prior right frontal and right temporal burr hole placement. No skull fractures. Visualized paranasal sinuses, bilateral mastoid air cells and bilateral middle ear cavities well-aerated. Severe bilateral carotid siphon and vertebral artery atherosclerosis.  CT CERVICAL SPINE FINDINGS  No fractures identified involving the cervical spine. Generalized osseous demineralization. Soft tissue window images demonstrate multilevel disc protrusions including a central and left paracentral protrusion at C2-3 with calcification in the posterior annular fibers, a central disc protrusion at C3-4, a central disc extrusion at C4-5 with compression of the anterior cord, and a broad-based central disc protrusion at C5-6. The extrusion at C5-6 has progressed since the prior CT, while the other findings are unchanged. Facet joints intact throughout with mild degenerative changes. Exaggeration of the usual cervical lordosis, likely due to an exaggerated thoracic kyphosis. Craniocervical junction intact. C1-C2 articulation intact with severe degenerative changes. Calcified pannus posterior to the dens. Lateral masses intact.  Pleuroparenchymal scarring involving both lung apices with associated pleural calcification.  IMPRESSION: 1. No acute intracranial abnormality. 2. Stable moderate to severe generalized atrophy and moderate chronic microvascular ischemic changes of the white matter. 3. No cervical spine fractures identified. 4. Multilevel cervical disk disease as detailed above, most significantly a central disc extrusion at C5-6 with associated mild cord compression.   Electronically Signed   By: Hulan Saas M.D.   On: 09/02/2015 14:45   Ct Cervical Spine Wo Contrast  09/06/2015   CLINICAL DATA:  Recent fall from bed with headaches and neck  pain  EXAM: CT HEAD WITHOUT CONTRAST  CT CERVICAL SPINE WITHOUT CONTRAST  TECHNIQUE: Multidetector CT imaging of the head and cervical spine was performed following the standard protocol without intravenous contrast. Multiplanar CT image reconstructions of the cervical spine were also generated.  COMPARISON:  09/02/2015  FINDINGS: CT HEAD FINDINGS  The bony calvarium is intact. No gross soft tissue abnormality is seen. Changes of prior burr hole placement on the right are again noted. Atrophic changes are noted. No findings to suggest acute hemorrhage, acute infarction or space-occupying mass lesion are noted.  CT CERVICAL SPINE FINDINGS  Scarring is noted in the apices bilaterally. Seven cervical segments are well visualized. Vertebral body height is well maintained. Degenerative changes of the facet joints are noted. No acute fracture is seen. The surrounding soft tissue structures show evidence of multilevel disc bulging from C2-C5. No definitive cord impingement is seen.  IMPRESSION: CT of the head: Atrophic changes without acute abnormality.  CT of the cervical spine: Multilevel degenerative change as described. No acute abnormality noted.   Electronically Signed   By: Alcide Clever M.D.   On: 09/06/2015 10:10   Ct Cervical Spine Wo Contrast  09/02/2015   CLINICAL DATA:  81 year old who had a possible unwitnessed fall out of bed at the nursing home earlier today, though the patient denies falling. Prior history of subdural hematoma.  EXAM: CT HEAD WITHOUT CONTRAST  CT CERVICAL SPINE WITHOUT CONTRAST  TECHNIQUE: Multidetector CT imaging of the head and cervical spine was performed following the standard protocol without intravenous contrast. Multiplanar CT image reconstructions of the cervical spine were also generated.  COMPARISON:  CT head and cervical spine 05/19/2011. CT head 09/17/2009 dating back to 07/13/2009.  FINDINGS: CT HEAD FINDINGS  Moderate to severe cortical, deep and cerebellar atrophy, not  significantly changed dating back to 2010. Moderate changes of small vessel disease of the white matter diffusely, unchanged. No mass lesion. No midline shift. No acute hemorrhage or hematoma. No extra-axial fluid collections. No evidence of acute infarction. Calcification involving the meninges of the right frontal lobe approaching the vertex, likely dystrophic in related to the prior hematoma.  Prior right frontal and right temporal burr hole placement. No skull fractures. Visualized paranasal sinuses, bilateral mastoid air cells and bilateral middle ear cavities well-aerated. Severe bilateral carotid siphon and vertebral artery atherosclerosis.  CT CERVICAL SPINE FINDINGS  No fractures identified involving the cervical spine. Generalized osseous demineralization. Soft tissue window images demonstrate multilevel disc protrusions including a central and left paracentral protrusion at C2-3 with calcification in the posterior annular fibers, a central disc protrusion at C3-4, a central disc extrusion at C4-5 with compression of the anterior cord, and a broad-based central disc protrusion at C5-6. The extrusion at C5-6 has progressed since the prior CT, while the other findings are unchanged. Facet joints intact throughout with mild degenerative changes. Exaggeration of the usual cervical lordosis, likely due to an exaggerated thoracic kyphosis. Craniocervical junction intact. C1-C2 articulation intact with severe degenerative changes. Calcified pannus posterior to the dens. Lateral masses intact.  Pleuroparenchymal scarring involving both lung apices with associated pleural calcification.  IMPRESSION: 1. No acute intracranial abnormality. 2. Stable moderate to severe generalized atrophy and moderate chronic microvascular ischemic changes of the white matter. 3. No cervical spine fractures identified. 4. Multilevel cervical disk disease as detailed above, most significantly a central disc extrusion at C5-6 with  associated mild cord compression.   Electronically Signed   By: Hulan Saas M.D.   On: 09/02/2015 14:45   Dg Knee Complete 4 Views Left  09/06/2015   CLINICAL DATA:  Larey Seat out of bed this morning. Complaining of left leg pain.  EXAM: LEFT KNEE - COMPLETE 4+ VIEW  COMPARISON:  None.  FINDINGS: There is a fracture of the distal left femur. This extends along an oblique transverse plane, from the proximal medial metaphysis to the distal lateral metaphysis. Fracture is mildly displaced, posteriorly by 6 mm, as well as mildly impacted, by approximately 1 cm. No significant fracture angulation and no comminution.  No other fractures. Bones are diffusely demineralized. Knee joint is normally spaced and aligned. There is a small joint effusion.  There are dense vascular calcifications posteriorly.  IMPRESSION: 1. Fracture of the distal left femoral metaphysis as described.   Electronically Signed   By: Amie Portland M.D.   On: 09/06/2015 09:08   Dg Hip Unilat  With Pelvis 2-3 Views Left  09/06/2015   CLINICAL DATA:  Larey Seat out of bed this morning at skilled nursing facility, LEFT hip and leg pain, initial encounter  EXAM: DG HIP (WITH OR WITHOUT PELVIS) 2-3V LEFT  COMPARISON:  09/02/2015  FINDINGS: Marked osseous demineralization.  SI joints poorly visualized.  RIGHT hip joint space preserved.  Significant destruction of LEFT humeral head likely due to prior avascular necrosis with collapse.  Secondary degenerative changes of LEFT hip joint with acetabulum protrusio.  No acute fracture, dislocation, or additional bone destruction identified.  Significant scattered atherosclerotic calcifications.  Overall appearance stable since 09/02/2015  IMPRESSION: Osseous demineralization.  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  No acute fracture or dislocation identified.   Electronically Signed   By: Ulyses Southward M.D.   On:  09/06/2015 09:06   Dg Hip Unilat With Pelvis 2-3 Views Left  09/02/2015   CLINICAL DATA:  Left hip pain following a fall.  EXAM: DG HIP (WITH OR WITHOUT PELVIS) 2-3V LEFT  COMPARISON:  10/30/2012.  FINDINGS: Severe collapse of the left femoral head with progression. There is some residual heterogeneous sclerosis in the remaining portion of the femoral head, extending into the proximal neck. 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 No fracture or dislocation seen. Diffuse osteopenia. Arterial calcifications. Inferior vena cava filter.  IMPRESSION: 1. No fracture or dislocation. 2. Progressive collapse of the femoral head due to the avascular necrosis with significant bony remodeling of the acetabulum with protrusio. 3. Diffuse osteopenia.   Electronically Signed   By: Beckie Salts M.D.   On: 09/02/2015 13:10   Dg Femur 1v Left  09/06/2015   CLINICAL DATA:  Pain post fall from bed  EXAM: LEFT FEMUR 1 VIEW  COMPARISON:  09/02/2015  FINDINGS: Advanced flattening and remodeling of the left femoral head and superior acetabulum with some subchondral sclerosis. No fracture or dislocation. Extensive femoral arterial calcifications. Transverse fracture of the distal femoral metadiaphysis , displaced at least 5 mm.  IMPRESSION: 1. Distal femoral shaft fracture, displaced 2. Advanced left hip degenerative changes as previously noted.   Electronically Signed   By: Corlis Leak M.D.   On: 09/06/2015 09:26   Dg Femur Min 2 Views Left  09/02/2015   CLINICAL DATA:  Fall.  Hip pain.  EXAM: LEFT FEMUR 2 VIEWS  COMPARISON:  No prior.  FINDINGS: Peripheral vascular calcification. Chronic deformity left hip is noted with severe flattening of what remains of the femoral head and protrusio acetabula. No evidence of erosive arthropathy no acute fracture. Diffuse severe osteopenia. Peripheral vascular calcification.  IMPRESSION: Chronic changes as above.  No acute abnormality .   Electronically Signed   By: Maisie Fus   Register   On: 09/02/2015 13:35    Microbiology: Recent Results (from the past 240 hour(s))  MRSA PCR Screening     Status: None   Collection Time: 09/07/15  1:09 AM  Result Value Ref Range Status   MRSA by PCR NEGATIVE NEGATIVE Final    Comment:        The GeneXpert MRSA Assay (FDA approved for NASAL specimens only), is one component of a comprehensive MRSA colonization surveillance program. It is not intended to diagnose MRSA infection nor to guide or monitor treatment for MRSA infections.      Labs: Basic Metabolic Panel:  Recent Labs Lab 09/06/15 1119 09/07/15 0542  NA 131* 136  K 4.6 4.0  CL 95* 103  CO2 27 23  GLUCOSE 124* 113*  BUN 35* 35*  CREATININE 0.94 0.84  CALCIUM 8.6* 8.2*   Liver Function Tests: No results for input(s): AST, ALT, ALKPHOS, BILITOT, PROT, ALBUMIN in the last 168 hours. No results for input(s): LIPASE, AMYLASE in the last 168 hours. No results for input(s): AMMONIA in the last 168 hours. CBC:  Recent Labs Lab 09/06/15 1119 09/07/15 0542  WBC 12.6* 9.3  NEUTROABS 10.5*  --   HGB 12.5 10.5*  HCT 37.7 32.7*  MCV 84.7 87.2  PLT 197 166   Cardiac Enzymes: No results for input(s): CKTOTAL, CKMB, CKMBINDEX, TROPONINI in the last 168 hours. BNP: BNP (last 3 results)  Recent Labs  06/28/15 1700  BNP 875.8*    ProBNP (last 3 results) No results for input(s): PROBNP in the last 8760 hours.  CBG: No results for input(s): GLUCAP in the last 168 hours.     SignedMauro Kaufmann S  [facility] 09/10/2015, 8:58 AM

## 2015-09-10 NOTE — Progress Notes (Signed)
Pt for discharge to [facility].  CSW facilitated pt discharge needs including contacting facility, confirming [employer] liaison, Forrestine Him faxed discharge summary, providing RN phone number to call report, discussing with pt family at bedside, and arranging ambulance transport for pt to [facility].  CSW notified [facility] [place] per family request that pt transitioning to [facility].  Pt family coping appropriately with pt transfer to [facility].   No further social work needs identified at this time.   CSW signing off.   Loletta Specter, MSW, LCSW Clinical Social Work [phone]

## 2015-10-01 DEATH — deceased
